# Patient Record
Sex: Female | Born: 1990 | Race: Black or African American | Hispanic: No | Marital: Single | Smoking: Current some day smoker
Health system: Southern US, Community
[De-identification: ages and names within clinical notes are randomized; demographics above are authoritative.]

## PROBLEM LIST (undated history)

## (undated) ENCOUNTER — Inpatient Hospital Stay (HOSPITAL_COMMUNITY): Payer: Self-pay

## (undated) DIAGNOSIS — F99 Mental disorder, not otherwise specified: Secondary | ICD-10-CM

## (undated) DIAGNOSIS — F32A Depression, unspecified: Secondary | ICD-10-CM

## (undated) DIAGNOSIS — K649 Unspecified hemorrhoids: Secondary | ICD-10-CM

## (undated) DIAGNOSIS — N760 Acute vaginitis: Principal | ICD-10-CM

## (undated) DIAGNOSIS — F329 Major depressive disorder, single episode, unspecified: Secondary | ICD-10-CM

## (undated) DIAGNOSIS — B9689 Other specified bacterial agents as the cause of diseases classified elsewhere: Secondary | ICD-10-CM

## (undated) DIAGNOSIS — F419 Anxiety disorder, unspecified: Secondary | ICD-10-CM

## (undated) DIAGNOSIS — N926 Irregular menstruation, unspecified: Secondary | ICD-10-CM

## (undated) HISTORY — DX: Other specified bacterial agents as the cause of diseases classified elsewhere: B96.89

## (undated) HISTORY — DX: Mental disorder, not otherwise specified: F99

## (undated) HISTORY — DX: Acute vaginitis: N76.0

## (undated) HISTORY — PX: NO PAST SURGERIES: SHX2092

## (undated) HISTORY — DX: Irregular menstruation, unspecified: N92.6

---
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
## 2011-01-31 ENCOUNTER — Inpatient Hospital Stay (HOSPITAL_COMMUNITY)
Admission: AD | Admit: 2011-01-31 | Discharge: 2011-01-31 | Disposition: A | Discharge status: Home / Self Care | Payer: Self-pay | Source: Ambulatory Visit | Attending: Obstetrics & Gynecology | Admitting: Obstetrics & Gynecology

## 2011-01-31 ENCOUNTER — Encounter (HOSPITAL_COMMUNITY): Payer: Self-pay

## 2011-01-31 DIAGNOSIS — A499 Bacterial infection, unspecified: Secondary | ICD-10-CM | POA: Insufficient documentation

## 2011-01-31 DIAGNOSIS — R109 Unspecified abdominal pain: Secondary | ICD-10-CM | POA: Insufficient documentation

## 2011-01-31 DIAGNOSIS — Z3202 Encounter for pregnancy test, result negative: Secondary | ICD-10-CM

## 2011-01-31 DIAGNOSIS — N760 Acute vaginitis: Secondary | ICD-10-CM | POA: Insufficient documentation

## 2011-01-31 DIAGNOSIS — B9689 Other specified bacterial agents as the cause of diseases classified elsewhere: Secondary | ICD-10-CM | POA: Insufficient documentation

## 2011-01-31 HISTORY — DX: Major depressive disorder, single episode, unspecified: F32.9

## 2011-01-31 HISTORY — DX: Anxiety disorder, unspecified: F41.9

## 2011-01-31 HISTORY — DX: Depression, unspecified: F32.A

## 2011-01-31 LAB — URINALYSIS, ROUTINE W REFLEX MICROSCOPIC
Nitrite: NEGATIVE
Protein, ur: NEGATIVE mg/dL
Specific Gravity, Urine: 1.025 (ref 1.005–1.030)
Urobilinogen, UA: 0.2 mg/dL (ref 0.0–1.0)

## 2011-01-31 LAB — URINE MICROSCOPIC-ADD ON

## 2011-01-31 LAB — WET PREP, GENITAL: Trich, Wet Prep: NONE SEEN

## 2011-01-31 LAB — POCT PREGNANCY, URINE: Preg Test, Ur: NEGATIVE

## 2011-01-31 MED ORDER — METRONIDAZOLE 500 MG PO TABS: 500.0000 mg | ORAL_TABLET | Freq: Two times a day (BID) | ORAL | Status: AC

## 2011-01-31 NOTE — ED Provider Notes (Signed)
Attestation of Attending Supervision of Advanced Practitioner: Evaluation and management procedures were performed by the PA/NP/CNM/OB Fellow under my supervision/collaboration. Chart reviewed, and agree with management and plan.  Kerri Asche, M.D. 01/31/2011 9:58 AM   

## 2011-01-31 NOTE — ED Provider Notes (Signed)
History   Pt presents today c/o lower abd cramping, weight gain, and nausea. She states she has been trying to get pregnant. She denies vag irritation, fever, or any other sx at this time. She reports her last episode of intercourse was yesterday.  Chief Complaint  Patient presents with  . Abdominal Pain   HPI  OB History    Grav Para Term Preterm Abortions TAB SAB Ect Mult Living   1    1  1          Past Medical History  Diagnosis Date  . Anxiety   . Depression     Past Surgical History  Procedure Date  . No past surgeries     History reviewed. No pertinent family history.  History  Substance Use Topics  . Smoking status: Never Smoker   . Smokeless tobacco: Never Used  . Alcohol Use: No    Allergies: No Known Allergies  Prescriptions prior to admission  Medication Sig Dispense Refill  . benzocaine (ORAJEL MAXIMUM STRENGTH) 20 % GEL Use as directed 1 application in the mouth or throat 2 (two) times daily as needed. For toothache.       . ibuprofen (ADVIL,MOTRIN) 200 MG tablet Take 200 mg by mouth every 6 (six) hours as needed. For stomach pains.         Review of Systems  Constitutional: Negative for fever.  Cardiovascular: Negative for chest pain.  Gastrointestinal: Positive for nausea and abdominal pain. Negative for vomiting, diarrhea and constipation.  Genitourinary: Negative for dysuria, urgency, frequency and hematuria.  Neurological: Negative for dizziness and headaches.  Psychiatric/Behavioral: Negative for depression and suicidal ideas.   Physical Exam   Blood pressure 115/76, pulse 80, temperature 98.6 F (37 C), temperature source Oral, resp. rate 16, height 5\' 2"  (1.575 m), weight 170 lb (77.111 kg), last menstrual period 01/04/2011.  Physical Exam  Nursing note and vitals reviewed. Constitutional: She is oriented to person, place, and time. She appears well-developed and well-nourished. No distress.  HENT:  Head: Normocephalic and atraumatic.    Eyes: EOM are normal. Pupils are equal, round, and reactive to light.  GI: Soft. She exhibits no distension. There is no tenderness. There is no rebound and no guarding.  Genitourinary: No bleeding around the vagina. Vaginal discharge found.       Pt tender on exam which made bimanual difficult. However, no obvious masses noted.  Neurological: She is alert and oriented to person, place, and time.  Skin: Skin is warm and dry. She is not diaphoretic.  Psychiatric: She has a normal mood and affect. Her behavior is normal. Judgment and thought content normal.    MAU Course  Procedures  Wet prep and GC/Chlamydia cultures done.  Results for orders placed during the hospital encounter of 01/31/11 (from the past 24 hour(s))  URINALYSIS, ROUTINE W REFLEX MICROSCOPIC     Status: Abnormal   Collection Time   01/31/11  8:14 AM      Component Value Range   Color, Urine YELLOW  YELLOW    APPearance CLEAR  CLEAR    Specific Gravity, Urine 1.025  1.005 - 1.030    pH 5.5  5.0 - 8.0    Glucose, UA NEGATIVE  NEGATIVE (mg/dL)   Hgb urine dipstick SMALL (*) NEGATIVE    Bilirubin Urine NEGATIVE  NEGATIVE    Ketones, ur NEGATIVE  NEGATIVE (mg/dL)   Protein, ur NEGATIVE  NEGATIVE (mg/dL)   Urobilinogen, UA 0.2  0.0 - 1.0 (mg/dL)  Nitrite NEGATIVE  NEGATIVE    Leukocytes, UA NEGATIVE  NEGATIVE   URINE MICROSCOPIC-ADD ON     Status: Normal   Collection Time   01/31/11  8:14 AM      Component Value Range   Squamous Epithelial / LPF RARE  RARE    WBC, UA 0-2  <3 (WBC/hpf)   RBC / HPF 0-2  <3 (RBC/hpf)  POCT PREGNANCY, URINE     Status: Normal   Collection Time   01/31/11  8:17 AM      Component Value Range   Preg Test, Ur NEGATIVE    WET PREP, GENITAL     Status: Abnormal   Collection Time   01/31/11  8:49 AM      Component Value Range   Yeast, Wet Prep NONE SEEN  NONE SEEN    Trich, Wet Prep NONE SEEN  NONE SEEN    Clue Cells, Wet Prep FEW (*) NONE SEEN    WBC, Wet Prep HPF POC FEW (*) NONE  SEEN      Assessment and Plan  BV: discussed with pt at length. Will tx with Flagyl. Warned of antabuse reaction  Neg preg test: discussed with pt at length. Advised pt to begin prenatal vit ASAP. She understands that it is best to be on prenatal vit at least 3 months prior to becoming pregnant. Also advised pt to establish care with PCP or OB/GYN prior to becoming preg.  Clinton Gallant. Rice III, DrHSc, MPAS, PA-C  01/31/2011, 8:54 AM   Henrietta Hoover, PA 01/31/11 [phone]

## 2011-01-31 NOTE — Progress Notes (Signed)
Pt states LMP-01/04/2011, had one positive/one negative upt. Has had pressure with voiding. Denies urgency/burning. Also denies bleeding or vag d/c changes. LLQ pain, dull and constant, rates 5/10.

## 2011-02-02 LAB — GC/CHLAMYDIA PROBE AMP, GENITAL
Chlamydia, DNA Probe: POSITIVE — AB
GC Probe Amp, Genital: NEGATIVE

## 2011-05-16 ENCOUNTER — Emergency Department (HOSPITAL_COMMUNITY)
Admission: EM | Admit: 2011-05-16 | Discharge: 2011-05-16 | Disposition: A | Discharge status: Home / Self Care | Payer: Self-pay | Attending: Emergency Medicine | Admitting: Emergency Medicine

## 2011-05-16 ENCOUNTER — Encounter (HOSPITAL_COMMUNITY): Payer: Self-pay | Admitting: Emergency Medicine

## 2011-05-16 DIAGNOSIS — F329 Major depressive disorder, single episode, unspecified: Secondary | ICD-10-CM | POA: Insufficient documentation

## 2011-05-16 DIAGNOSIS — F3289 Other specified depressive episodes: Secondary | ICD-10-CM | POA: Insufficient documentation

## 2011-05-16 DIAGNOSIS — F411 Generalized anxiety disorder: Secondary | ICD-10-CM | POA: Insufficient documentation

## 2011-05-16 DIAGNOSIS — Z3202 Encounter for pregnancy test, result negative: Secondary | ICD-10-CM | POA: Insufficient documentation

## 2011-05-16 NOTE — Discharge Instructions (Signed)
Your pregnancy test was negative. Please follow up with your primary care doctor as needed. Over the counter pregnancy tests are equally as accurate as the pregnancy tests we use here. Please consider using over the counter tests in the future.  RESOURCE GUIDE  Dental Problems  Patients with Medicaid: [facility] [phone] [address]                                           [phone] Phone:  [phone]                                                  Phone:  [phone]  If unable to pay or uninsured, contact:  [facility] or [facility] to become qualified for the adult dental clinic.  Chronic Pain Problems Contact [facility]  [phone] Patients need to be referred by their primary care doctor.  Insufficient Money for Medicine Contact [employer]:  call "211" or [facility] [phone].  No Primary Care Doctor Call [facility]  [phone] Other agencies that provide inexpensive medical care    [facility]  [phone]    [facility]  [phone]    [facility]  [phone]    [facility]  [phone]    [facility]  [phone]    [facility]  [phone]  Psychological Services [facility]  [phone] [facility]  [phone] [facility]   [phone] (emergency services [phone])  Substance Abuse Resources [facility]  [phone] [facility] [phone] [facility] [phone] Floydene Flock [phone] Residential & Outpatient Substance Abuse Program  [phone]  Abuse/Neglect [facility] Child Abuse Hotline [phone] [facility] Child Abuse Hotline [phone] (After Hours)  Emergency Shelter [facility] [phone]  Maternity Homes [facility] [place] [facility] [phone] [facility] [phone]  MRSA Hotline #:   705-024-2385    [facility] Resources  [facility]     [employer]                          [facility] Dept. [address] [place]                       [address]      [address]  [place]                                                Cristobal Goldmann Phone:  [phone]  Phone:  [phone]                 Phone:  [phone]  [facility] Phone:  [phone]  [facility] Child Abuse Hotline [phone] [phone] (After Hours)  Pregnancy Tests HOW DO PREGNANCY TESTS WORK? All pregnancy tests look for a special hormone in the urine or blood that is only present in pregnant women. This hormone, human chorionic gonadotropin (hCG), is also called the pregnancy hormone.  WHAT IS THE DIFFERENCE BETWEEN A URINE AND A BLOOD PREGNANCY TEST? IS ONE BETTER THAN THE OTHER? There are two types of pregnancy tests.  Blood tests.   Urine tests.  Both tests look for the presence of hCG, the pregnancy hormone. Many women use a urine test or home pregnancy test (HPT) to find out if they are pregnant. HPTs are cheap, easy to use, can be done at home, and are private. When a woman has a positive result on an HPT, she needs to see her caregiver right away. The caregiver can confirm a positive HPT result with another urine test, a blood test, ultrasound, and a pelvic exam.  There are two types of blood tests you can get from a caregiver.   A quantitative blood test (or the beta hCG test). This test measures the exact amount of hCG in the blood. This means it can pick up very small amounts of hCG, making it a very accurate test.   A qualitative hCG blood test. This test gives a simple yes or no answer to whether you are pregnant. This test is more like a urine test in terms of its accuracy.  Blood tests can pick up hCG earlier in a  pregnancy than urine tests can. Blood tests can tell if you are pregnant about 6 to 8 days after you release an egg from an ovary (ovulate). Urine tests can determine pregnancy about 2 weeks after ovulation. Some more sensitive urine tests can tell if you are pregnant as early as 6 days or even 1 day after you miss a menstrual period.  HOW IS A HOME PREGNANCY TEST DONE?  There are many types of home pregnancy tests or HPTs that can be bought over-the-counter at drug or discount stores.   Some involve collecting your urine in a cup and dipping a stick into the urine or putting some of the urine into a special container with an eyedropper.   Others are done by placing a stick into your urine stream.   Tests vary in how long you need to wait for the stick or container to turn a certain color or have a symbol on it (like a plus or a minus).   All tests come with written instructions. Most tests also have toll-free phone numbers to call if you have any questions about how to do the test or read the results.  HOW ACCURATE ARE HOME PREGNANCY TESTS?  HPTs are very accurate. Most brands of HPTs say they are 97% to 99% accurate when taken 1 week after missing your menstrual period, but this can vary with actual use. Each brand varies in how sensitive it is in picking up the pregnancy hormone hCG. If a test is not done correctly, it will be less accurate. Always check the package to make sure it is not past its expiration date. If it is, it will not be accurate. Most brands of HPTs tell users to do the test again in a few days, no matter  what the results.  If you use an HPT too early in your pregnancy, you may not have enough of the pregnancy hormone hCG in your urine to have a positive test result. Most HPTs will be accurate if you test yourself around the time your period is due (about 2 weeks after you ovulate). You can get a negative test result if you are not pregnant or if you ovulated later than you thought  you did. You may also have problems with the pregnancy, which affects the amount of hCG you have in your urine. If your HPT is negative, test yourself again within a few days to 1 week. If you keep getting a negative result and think you are pregnant, talk with your caregiver right away about getting a blood pregnancy test.  FALSE POSITIVE PREGNANCY TEST A false positive HPT can happen if there is blood or protein present in your urine. A false positive can also happen if you were recently pregnant or if you take a pregnancy test too soon after taking fertility drug that contains hCG. Also, some prescription medicines such as water pills (diuretics), tranquilizers, seizure medicines, psychiatric medicines, and allergy and nausea medicines (promethazine) give false positive readings. FALSE NEGATIVE PREGNANCY TEST  A false negative HPT can happen if you do the test too early. Try to wait until you are at least 1 day late for your menstrual period.   It may happen if you wait too long to test the urine (longer than 15 minutes).   It may also happen if the urine is too diluted because you drank a lot of fluids before getting the urine sample. It is best to test the first morning urine after you get out of bed.  If your menstrual period did not start after a week of a negative HPT, repeat the pregnancy test. CAN ANYTHING INTERFERE WITH HOME PREGNANCY TEST RESULTS?  Most medicines, both over-the-counter and prescription drugs, including birth control pills and antibiotics, should not affect the results of a HPT. Only those drugs that have the pregnancy hormone hCG in them can give a false positive test result. Drugs that have hCG in them may be used for treating infertility (not being able to get pregnant). Alcohol and illegal drugs do not affect HPT results, but you should not be using these substances if you are trying to get pregnant. If you have a positive pregnancy test, call your caregiver to make an  appointment to begin prenatal care. Document Released: 02/02/2003 Document Revised: 01/19/2011 Document Reviewed: 04/15/2010 [facility] Patient Information 2012 [place], [place].

## 2011-05-16 NOTE — ED Notes (Signed)
Pt presents for pregnancy test.  Pt reports last period was 2/23; reports nausea, vaginal discharge that pt describes as "white sticky rice" and foul odor.  Pt requesting discharge papers, reports that she needs to leave.

## 2011-05-16 NOTE — ED Provider Notes (Signed)
Medical screening examination/treatment/procedure(s) were conducted as a shared visit with non-physician practitioner(s) and myself.  I personally evaluated the patient during the encounter  Toy Baker, MD 05/16/11 1549

## 2011-05-16 NOTE — ED Provider Notes (Signed)
History     CSN: [id number]  Arrival date & time 05/16/11  0534   First MD Initiated Contact with Patient 05/16/11 0710      Chief Complaint  Patient presents with  . Possible Pregnancy    (Consider location/radiation/quality/duration/timing/severity/associated sxs/prior treatment) HPI Hx from patient. 21 year old female who presents requesting a pregnancy test. Last menstrual period was February 23. She is sexually active and denies contraceptive use. Denies abdominal pain, nausea, vomiting. No other complaints. No pregnancy tests taken at home.  Past Medical History  Diagnosis Date  . Anxiety   . Depression     Past Surgical History  Procedure Date  . No past surgeries     No family history on file.  History  Substance Use Topics  . Smoking status: Never Smoker   . Smokeless tobacco: Never Used  . Alcohol Use: No    OB History    Grav Para Term Preterm Abortions TAB SAB Ect Mult Living   2    1  1          Review of Systems review of systems as indicated in the history of present illness.  Allergies  Review of patient's allergies indicates no known allergies.  Home Medications   Current Outpatient Rx  Name Route Sig Dispense Refill  . PHILLIPS STOOL SOFTENER PO Oral Take 1 capsule by mouth daily.      BP 129/68  Pulse 70  Temp(Src) 98.6 F (37 C) (Oral)  Resp 20  SpO2 98%  LMP 01/04/2011  Physical Exam  Nursing note and vitals reviewed. Constitutional: She appears well-developed and well-nourished. No distress.  HENT:  Head: Normocephalic and atraumatic.  Cardiovascular: Normal rate.   Pulmonary/Chest: Effort normal.  Skin: Skin is warm and dry. She is not diaphoretic.  Psychiatric: Her affect is angry.       States "I already know my test is negative. I just want my discharge paperwork."    ED Course  Procedures (including critical care time)   Labs Reviewed  POCT PREGNANCY, URINE   No results found.   1. Pregnancy test negative        MDM  Pregnancy test negative. Return precautions discussed.       Grant Fontana, [place] 05/16/11 [phone]

## 2011-05-16 NOTE — ED Notes (Signed)
PT. REQUESTING PREGNANCY TEST , LMP LAST FEB. 2013 .

## 2011-06-12 LAB — OB RESULTS CONSOLE RUBELLA ANTIBODY, IGM: Rubella: IMMUNE

## 2011-06-12 LAB — OB RESULTS CONSOLE ABO/RH: RH Type: POSITIVE

## 2011-06-12 LAB — OB RESULTS CONSOLE HIV ANTIBODY (ROUTINE TESTING): HIV: NONREACTIVE

## 2011-06-12 LAB — OB RESULTS CONSOLE RPR: RPR: NONREACTIVE

## 2011-10-10 ENCOUNTER — Encounter (HOSPITAL_COMMUNITY): Payer: Self-pay | Admitting: *Deleted

## 2011-10-10 ENCOUNTER — Inpatient Hospital Stay (HOSPITAL_COMMUNITY)
Admission: AD | Admit: 2011-10-10 | Discharge: 2011-10-10 | Disposition: A | Discharge status: Home / Self Care | Payer: Medicaid - Out of State | Source: Ambulatory Visit | Attending: Obstetrics & Gynecology | Admitting: Obstetrics & Gynecology

## 2011-10-10 DIAGNOSIS — Z331 Pregnant state, incidental: Secondary | ICD-10-CM

## 2011-10-10 DIAGNOSIS — O99891 Other specified diseases and conditions complicating pregnancy: Secondary | ICD-10-CM | POA: Insufficient documentation

## 2011-10-10 DIAGNOSIS — O0930 Supervision of pregnancy with insufficient antenatal care, unspecified trimester: Secondary | ICD-10-CM

## 2011-10-10 HISTORY — DX: Unspecified hemorrhoids: K64.9

## 2011-10-10 NOTE — MAU Note (Signed)
Moved here form [place]; c/o some spotting; desires to find a OB in [place];

## 2011-10-10 NOTE — MAU Provider Note (Signed)
Chief Complaint:  Vaginal Bleeding   First Provider Initiated Contact with Patient 10/12/11 0507    HPI: Tiffany [name] is a 21 y.o. G2P0010 at 6w1dwho presents to maternity admissions requesting list of providers. Moved to [place] from [place]. No prenatal in 6 weeks and knows she needs to be seen. Scant spotting ~4 days ago. Wants to know sex of baby.  Denies contractions, leakage of fluid or vaginal bleeding. Good fetal movement. Had an episode 3 days ago of pink tinge on toilet paper but no frank blood.   Past Medical History: Past Medical History  Diagnosis Date  . Anxiety   . Depression   . Hemorrhoids     Past obstetric history: OB History    Grav Para Term Preterm Abortions TAB SAB Ect Mult Living   2    1  1         # Outc Date GA Lbr Len/2nd Wgt Sex Del Anes PTL Lv   1 SAB            2 CUR               Past Surgical History: Past Surgical History  Procedure Date  . No past surgeries     Family History: Family History  Problem Relation Age of Onset  . Other Neg Hx     Social History: History  Substance Use Topics  . Smoking status: Never Smoker   . Smokeless tobacco: Never Used  . Alcohol Use: No    Allergies:  Allergies  Allergen Reactions  . Mushroom Extract Complex Shortness Of Breath and Swelling    Throat swelling    Meds:  Prescriptions prior to admission  Medication Sig Dispense Refill  . Prenatal Vit-Fe Fumarate-FA (PRENATAL MULTIVITAMIN) TABS Take 1 tablet by mouth every morning.        ROS: Pertinent findings in history of present illness.  Physical Exam  Blood pressure 104/57, pulse 64, temperature 98 F (36.7 C), temperature source Oral, resp. rate 16, last menstrual period 01/04/2011. GENERAL: Well-developed, well-nourished female in no acute distress.  HEENT: normocephalic HEART: normal rate RESP: normal effort ABDOMEN: Soft, non-tender, gravid appropriate for gestational age EXTREMITIES: Nontender, no edema NEURO:  alert and oriented  Pelvic: deferred. FHT:  Baseline 140s- just dopplers. No strip Contractions: none   Labs: No results found for this or any previous visit (from the past 24 hour(s)).  Imaging:  No results found. ED Course See plan.   Assessment: 1. IUP (intrauterine pregnancy), incidental     Plan: Discharge home. No further workup here tonight in an asymptomatic pt.  List of providers in the area given to patient with instructions to call for appt - discussed that [place] is not done in the MAU for gender and this can be discussed with her new doctor team here - will send record release to [place] for previous records.  Labor precautions and fetal kick counts  Medication List  As of 10/10/2011  8:50 PM   ASK your doctor about these medications         prenatal multivitamin Tabs   Take 1 tablet by mouth every morning.           Dr. Erin Fulling and I were consulted RE: POC and agree w/ the above.  [place], [place] 10/11/2011 8:28 PM

## 2011-10-12 NOTE — MAU Provider Note (Signed)
Attestation of Attending Supervision of Advanced Practitioner (CNM/NP): Evaluation and management procedures were performed by the Advanced Practitioner under my supervision and collaboration.  I have reviewed the Advanced Practitioner's note and chart, and I agree with the management and plan.  HARRAWAY-SMITH, Loralie Malta 3:04 PM     

## 2011-12-13 ENCOUNTER — Encounter (HOSPITAL_COMMUNITY): Payer: Self-pay | Admitting: *Deleted

## 2011-12-13 ENCOUNTER — Inpatient Hospital Stay (HOSPITAL_COMMUNITY)
Admission: EM | Admit: 2011-12-13 | Discharge: 2011-12-14 | Disposition: A | Discharge status: Other Acute Inpatient Hospital | Payer: Self-pay | Attending: Emergency Medicine | Admitting: Emergency Medicine

## 2011-12-13 DIAGNOSIS — F329 Major depressive disorder, single episode, unspecified: Secondary | ICD-10-CM | POA: Insufficient documentation

## 2011-12-13 DIAGNOSIS — F3289 Other specified depressive episodes: Secondary | ICD-10-CM | POA: Insufficient documentation

## 2011-12-13 DIAGNOSIS — Z8679 Personal history of other diseases of the circulatory system: Secondary | ICD-10-CM | POA: Insufficient documentation

## 2011-12-13 DIAGNOSIS — O9989 Other specified diseases and conditions complicating pregnancy, childbirth and the puerperium: Secondary | ICD-10-CM | POA: Insufficient documentation

## 2011-12-13 DIAGNOSIS — F411 Generalized anxiety disorder: Secondary | ICD-10-CM | POA: Insufficient documentation

## 2011-12-13 DIAGNOSIS — R109 Unspecified abdominal pain: Secondary | ICD-10-CM | POA: Insufficient documentation

## 2011-12-13 DIAGNOSIS — O479 False labor, unspecified: Secondary | ICD-10-CM

## 2011-12-13 LAB — URINALYSIS, ROUTINE W REFLEX MICROSCOPIC
Hgb urine dipstick: NEGATIVE
Ketones, ur: NEGATIVE mg/dL
Protein, ur: NEGATIVE mg/dL
Urobilinogen, UA: 0.2 mg/dL (ref 0.0–1.0)

## 2011-12-13 NOTE — Progress Notes (Addendum)
Pt is 33wk2day (EDC 01/29/12), G2P0. C/O sharp abdominal pain x2hrs.  External monitoring started @2219 , fht 150bpm, assessing for contractions.  No c/o leaking fluid or vag bleeding.

## 2011-12-13 NOTE — ED Notes (Signed)
Patient placed on fetal monitor.  Spoke with Olegario Messier at [facility] [employer].  Fetal heart rate 149-153.  Patient c/o sharp abdominal pain that started around 2 hours ago.  Patient denies any leakage of fluid, N/V or bleeding.

## 2011-12-13 NOTE — ED Notes (Signed)
Patient placed on bedside commode.

## 2011-12-13 NOTE — ED Notes (Signed)
Report called to Mariane Duval, at [facility].

## 2011-12-13 NOTE — ED Notes (Signed)
Pregnant, sharp abdominal pain for the past 2 hours, denies nausea or vomiting

## 2011-12-13 NOTE — ED Provider Notes (Signed)
History    This chart was scribed for Tiffany Lennert, MD, MD by Smitty Pluck. The patient was seen in room APA01 and the patient's care was started at 10:17PM.   CSN: [id number]  Arrival date & time 12/13/11  2156      Chief Complaint  Patient presents with  . Abdominal Pain    (Consider location/radiation/quality/duration/timing/severity/associated sxs/prior treatment) Patient is a 21 y.o. female presenting with abdominal pain. The history is provided by the patient. No language interpreter was used.  Abdominal Pain The primary symptoms of the illness include abdominal pain. The primary symptoms of the illness do not include fatigue or diarrhea. The current episode started 1 to 2 hours ago. The onset of the illness was sudden. The problem has not changed since onset. The abdominal pain radiates to the back.  The patient states that she believes she is currently pregnant. Symptoms associated with the illness do not include hematuria, frequency or back pain.   Tiffany [name] is a 21 y.o. female who is [redacted] weeks pregnant presents to the Emergency Department complaining of sharp abdominal pain radiating to her back onset today 2 hours ago. Pt reports that the contractions are 5-6 minutes apart. Pt denies nausea, vomiting, diarrhea and any other pain.    Pt goes to [facility] for medical treatment   Past Medical History  Diagnosis Date  . Anxiety   . Depression   . Hemorrhoids     Past Surgical History  Procedure Date  . No past surgeries     Family History  Problem Relation Age of Onset  . Other Neg Hx     History  Substance Use Topics  . Smoking status: Never Smoker   . Smokeless tobacco: Never Used  . Alcohol Use: No    OB History    Grav Para Term Preterm Abortions TAB SAB Ect Mult Living   2    1  1          Review of Systems  Constitutional: Negative for fatigue.  HENT: Negative for congestion, sinus pressure and ear discharge.   Eyes:  Negative for discharge.  Respiratory: Negative for cough.   Cardiovascular: Negative for chest pain.  Gastrointestinal: Positive for abdominal pain. Negative for diarrhea.  Genitourinary: Negative for frequency and hematuria.  Musculoskeletal: Negative for back pain.  Skin: Negative for rash.  Neurological: Negative for seizures and headaches.  Hematological: Negative.   Psychiatric/Behavioral: Negative for hallucinations.    Allergies  Mushroom extract complex  Home Medications   Current Outpatient Rx  Name Route Sig Dispense Refill  . PRENATAL MULTIVITAMIN CH Oral Take 1 tablet by mouth every morning.      BP 111/69  Pulse 71  Temp 98.1 F (36.7 C) (Oral)  Resp 20  Ht 5\' 2"  (1.575 m)  Wt 192 lb (87.091 kg)  BMI 35.12 kg/m2  SpO2 100%  LMP 01/04/2011  Physical Exam  Nursing note and vitals reviewed. Constitutional: She is oriented to person, place, and time. She appears well-developed.  HENT:  Head: Normocephalic and atraumatic.  Eyes: Conjunctivae normal and EOM are normal. No scleral icterus.  Neck: Neck supple. No thyromegaly present.  Cardiovascular: Normal rate and regular rhythm.  Exam reveals no gallop and no friction rub.   No murmur heard. Pulmonary/Chest: No stridor. She has no wheezes. She has no rales. She exhibits no tenderness.  Abdominal: She exhibits no distension. There is no tenderness. There is no rebound.  Genitourinary: Vagina normal.  0% effaced  cervix closed   Musculoskeletal: Normal range of motion. She exhibits no edema.  Lymphadenopathy:    She has no cervical adenopathy.  Neurological: She is oriented to person, place, and time. Coordination normal.  Skin: No rash noted. No erythema.  Psychiatric: She has a normal mood and affect. Her behavior is normal.    ED Course  Procedures (including critical care time) DIAGNOSTIC STUDIES: Oxygen Saturation is 100% on room air, normal by my interpretation.    COORDINATION OF  CARE: 10:24 PM Discussed ED treatment with pt     Labs Reviewed - No data to display No results found.   No diagnosis found.  Dr[facility] Despina Hidden [facility]  MDM        The chart was scribed for me under my direct supervision.  I personally performed the history, physical, and medical decision making and all procedures in the evaluation of this patient.Tiffany Lennert, MD 12/13/11 [phone]

## 2011-12-14 ENCOUNTER — Encounter (HOSPITAL_COMMUNITY): Payer: Self-pay | Admitting: *Deleted

## 2011-12-14 NOTE — MAU Note (Signed)
Pt states she had sex around 2200 tonight-after that she started having a lot of cramping and went to [facility] to be evaluated-states the craming is getting less now

## 2011-12-14 NOTE — Progress Notes (Signed)
None     Chief Complaint:  Abdominal Pain pt was evaluated at [facility] and sent here for further observation.  She came in with c/o cramps, which have lessened now.  Only treatment has been IVF  Tiffany [name] is  21 y.o. G2P0010 at [redacted]w[redacted]d presents complaining of Abdominal Pain .  She states none contractions are associated with none vaginal bleeding, intact membranes, along with active fetal movement.   Obstetrical/Gynecological History: Menstrual History: OB History    Grav Para Term Preterm Abortions TAB SAB Ect Mult Living   2    1  1           Patient's last menstrual period was 01/04/2011.     Past Medical History: Past Medical History  Diagnosis Date  . Anxiety   . Depression   . Hemorrhoids     Past Surgical History: Past Surgical History  Procedure Date  . No past surgeries     Family History: Family History  Problem Relation Age of Onset  . Other Neg Hx   . Diabetes Mother     Social History: History  Substance Use Topics  . Smoking status: Never Smoker   . Smokeless tobacco: Never Used  . Alcohol Use: No    Allergies:  Allergies  Allergen Reactions  . Mushroom Extract Complex Shortness Of Breath and Swelling    Throat swelling    Meds:  Prescriptions prior to admission  Medication Sig Dispense Refill  . Prenatal Vit-Fe Fumarate-FA (PRENATAL MULTIVITAMIN) TABS Take 1 tablet by mouth every morning.        Review of Systems - Please refer to the aforementioned patients' reports.     Physical Exam  Blood pressure 101/65, pulse 73, temperature 98 F (36.7 C), temperature source Oral, resp. rate 20, height 5\' 2"  (1.575 m), weight 87.091 kg (192 lb), last menstrual period 01/04/2011, SpO2 98.00%. GENERAL: Well-developed, well-nourished female in no acute distress.  LUNGS: Clear to auscultation bilaterally.  HEART: Regular rate and rhythm. ABDOMEN: Soft, nontender, nondistended, gravid.  EXTREMITIES: Nontender, no edema, 2+  distal pulses. CERVICAL EXAM: Dilatation 0cm   Effacement 0%   Station -3   Presentation: cephalic FHT:  Baseline rate 140 bpm   Variability moderate  Accelerations present   Decelerations none Contractions: some irritability  Labs: Recent Results (from the past 24 hour(s))  URINALYSIS, ROUTINE W REFLEX MICROSCOPIC   Collection Time   12/13/11 11:00 PM      Component Value Range   Color, Urine YELLOW  YELLOW   APPearance CLEAR  CLEAR   Specific Gravity, Urine 1.020  1.005 - 1.030   pH 6.5  5.0 - 8.0   Glucose, UA NEGATIVE  NEGATIVE mg/dL   Hgb urine dipstick NEGATIVE  NEGATIVE   Bilirubin Urine NEGATIVE  NEGATIVE   Ketones, ur NEGATIVE  NEGATIVE mg/dL   Protein, ur NEGATIVE  NEGATIVE mg/dL   Urobilinogen, UA 0.2  0.0 - 1.0 mg/dL   Nitrite NEGATIVE  NEGATIVE   Leukocytes, UA NEGATIVE  NEGATIVE   Imaging Studies:  No results found.  Assessment: Tiffany [name] is  21 y.o. G2P0010 at [redacted]w[redacted]d presents with uterine irritability, resolved after IVF.  Plan: D/C home  CRESENZO-DISHMAN,Liborio Saccente 10/31/20131:13 AM

## 2012-01-10 LAB — OB RESULTS CONSOLE GBS: GBS: POSITIVE

## 2012-01-24 ENCOUNTER — Telehealth (HOSPITAL_COMMUNITY): Payer: Self-pay | Admitting: *Deleted

## 2012-01-24 ENCOUNTER — Encounter (HOSPITAL_COMMUNITY): Payer: Self-pay | Admitting: *Deleted

## 2012-01-24 NOTE — Telephone Encounter (Signed)
Preadmission screen  

## 2012-01-28 ENCOUNTER — Inpatient Hospital Stay (HOSPITAL_COMMUNITY)
Admission: RE | Admit: 2012-01-28 | Discharge: 2012-01-31 | DRG: 766 | Disposition: A | Discharge status: Home / Self Care | Payer: Medicaid Other | Source: Ambulatory Visit | Attending: Obstetrics & Gynecology | Admitting: Obstetrics & Gynecology

## 2012-01-28 ENCOUNTER — Encounter (HOSPITAL_COMMUNITY): Payer: Self-pay

## 2012-01-28 DIAGNOSIS — O365990 Maternal care for other known or suspected poor fetal growth, unspecified trimester, not applicable or unspecified: Principal | ICD-10-CM | POA: Diagnosis present

## 2012-01-28 DIAGNOSIS — D649 Anemia, unspecified: Secondary | ICD-10-CM | POA: Diagnosis not present

## 2012-01-28 DIAGNOSIS — Z22330 Carrier of Group B streptococcus: Secondary | ICD-10-CM

## 2012-01-28 DIAGNOSIS — O0930 Supervision of pregnancy with insufficient antenatal care, unspecified trimester: Secondary | ICD-10-CM

## 2012-01-28 DIAGNOSIS — O99892 Other specified diseases and conditions complicating childbirth: Secondary | ICD-10-CM | POA: Diagnosis present

## 2012-01-28 DIAGNOSIS — O9903 Anemia complicating the puerperium: Secondary | ICD-10-CM | POA: Diagnosis not present

## 2012-01-28 LAB — CBC
HCT: 38.2 % (ref 36.0–46.0)
Hemoglobin: 12.5 g/dL (ref 12.0–15.0)
MCH: 27.8 pg (ref 26.0–34.0)
MCV: 84.2 fL (ref 78.0–100.0)
MCV: 84.5 fL (ref 78.0–100.0)
Platelets: 209 10*3/uL (ref 150–400)
Platelets: 197 10*3/uL (ref 150–400)
RBC: 4.5 MIL/uL (ref 3.87–5.11)
RBC: 4.52 MIL/uL (ref 3.87–5.11)
WBC: 5.9 10*3/uL (ref 4.0–10.5)
WBC: 6.8 10*3/uL (ref 4.0–10.5)

## 2012-01-28 LAB — COMPREHENSIVE METABOLIC PANEL
Albumin: 2.7 g/dL — ABNORMAL LOW (ref 3.5–5.2)
BUN: 8 mg/dL (ref 6–23)
Calcium: 9 mg/dL (ref 8.4–10.5)
Creatinine, Ser: 0.89 mg/dL (ref 0.50–1.10)
Total Protein: 6.1 g/dL (ref 6.0–8.3)

## 2012-01-28 LAB — TYPE AND SCREEN: Antibody Screen: NEGATIVE

## 2012-01-28 MED ORDER — LACTATED RINGERS IV SOLN
INTRAVENOUS | Status: DC
  Administered 2012-01-28: 22:00:00 via INTRAVENOUS

## 2012-01-28 MED ORDER — MISOPROSTOL 25 MCG QUARTER TABLET
25.0000 ug | ORAL_TABLET | ORAL | Status: DC | PRN
  Administered 2012-01-28: 25 ug via VAGINAL
  Filled 2012-01-28: qty 0.25

## 2012-01-28 MED ORDER — PENICILLIN G POTASSIUM 5000000 UNITS IJ SOLR
5.0000 10*6.[IU] | Freq: Once | INTRAVENOUS | Status: AC
  Administered 2012-01-28: 5 10*6.[IU] via INTRAVENOUS
  Filled 2012-01-28: qty 5

## 2012-01-28 MED ORDER — OXYCODONE-ACETAMINOPHEN 5-325 MG PO TABS: 1.0000 | ORAL_TABLET | ORAL | Status: DC | PRN

## 2012-01-28 MED ORDER — TERBUTALINE SULFATE 1 MG/ML IJ SOLN: 0.2500 mg | Freq: Once | INTRAMUSCULAR | Status: AC | PRN

## 2012-01-28 MED ORDER — OXYTOCIN 40 UNITS IN LACTATED RINGERS INFUSION - SIMPLE MED
1.0000 m[IU]/min | INTRAVENOUS | Status: DC
  Administered 2012-01-28: 1 m[IU]/min via INTRAVENOUS
  Filled 2012-01-28: qty 1000

## 2012-01-28 MED ORDER — FENTANYL CITRATE 0.05 MG/ML IJ SOLN
100.0000 ug | INTRAMUSCULAR | Status: DC | PRN
  Administered 2012-01-28 – 2012-01-29 (×4): 100 ug via INTRAVENOUS
  Filled 2012-01-28 (×4): qty 2

## 2012-01-28 MED ORDER — FLEET ENEMA 7-19 GM/118ML RE ENEM: 1.0000 | ENEMA | RECTAL | Status: DC | PRN

## 2012-01-28 MED ORDER — IBUPROFEN 600 MG PO TABS: 600.0000 mg | ORAL_TABLET | Freq: Four times a day (QID) | ORAL | Status: DC | PRN

## 2012-01-28 MED ORDER — LIDOCAINE HCL (PF) 1 % IJ SOLN
30.0000 mL | INTRAMUSCULAR | Status: DC | PRN
  Filled 2012-01-28: qty 30

## 2012-01-28 MED ORDER — ACETAMINOPHEN 325 MG PO TABS
650.0000 mg | ORAL_TABLET | ORAL | Status: DC | PRN
  Administered 2012-01-28: 650 mg via ORAL
  Filled 2012-01-28: qty 2

## 2012-01-28 MED ORDER — LACTATED RINGERS IV SOLN
500.0000 mL | INTRAVENOUS | Status: DC | PRN
  Administered 2012-01-28: 500 mL via INTRAVENOUS

## 2012-01-28 MED ORDER — PENICILLIN G POTASSIUM 5000000 UNITS IJ SOLR
2.5000 10*6.[IU] | INTRAVENOUS | Status: DC
  Administered 2012-01-28 – 2012-01-29 (×4): 2.5 10*6.[IU] via INTRAVENOUS
  Filled 2012-01-28 (×8): qty 2.5

## 2012-01-28 MED ORDER — OXYTOCIN BOLUS FROM INFUSION: 500.0000 mL | INTRAVENOUS | Status: DC

## 2012-01-28 MED ORDER — CITRIC ACID-SODIUM CITRATE 334-500 MG/5ML PO SOLN
30.0000 mL | ORAL | Status: DC | PRN
  Administered 2012-01-29: 30 mL via ORAL
  Filled 2012-01-28: qty 15

## 2012-01-28 MED ORDER — ONDANSETRON HCL 4 MG/2ML IJ SOLN: 4.0000 mg | Freq: Four times a day (QID) | INTRAMUSCULAR | Status: DC | PRN

## 2012-01-28 MED ORDER — OXYTOCIN 40 UNITS IN LACTATED RINGERS INFUSION - SIMPLE MED: 62.5000 mL/h | INTRAVENOUS | Status: DC

## 2012-01-28 NOTE — H&P (Signed)
Tiffany [name] is a 21 y.o. female G2P0010 at [redacted]w[redacted]d presenting for induction of labor for IUGR. [place] on 11/24 showed EFW in the 3%ile range, with remains on [place] from 12/10. Dopplers normal and BPP 8/8. Pt reports rare/intermittent contractions but states she didn't realize the "crampy, tightening" feelings she has been having that "feels like menstrual cramps" were contractions. No bleeding or LOF. Good fetal movements. Denies fever/chills, RUQ pain, headache/change in vision.  Prenatal care from 7w to 16w in [place], then transferred care to [facility] at 31w; no PNC between 16 and 31 weeks. Uncomplicated prenatal course other than IUGR, as above.  Maternal Medical History:  Reason for admission: IOL for IUGR  Contractions: Frequency: irregular.   Intermittent  Fetal activity: Perceived fetal activity is normal.   Last perceived fetal movement was within the past hour.    Prenatal complications: IUGR (11/24, EFW at 3%ile; 12/10, IUGR remains, normal Dopplers).   Prenatal Complications - Diabetes: none.    OB History    Grav Para Term Preterm Abortions TAB SAB Ect Mult Living   2    1  1         Past Medical History  Diagnosis Date  . Anxiety   . Hemorrhoids   . Mental disorder     bipolar  . Depression    Past Surgical History  Procedure Date  . No past surgeries    Family History: family history includes Diabetes in her mother.  There is no history of Other. Social History:  reports that she has never smoked. She has never used smokeless tobacco. She reports that she does not drink alcohol or use illicit drugs.  ROS: See HPI  Dilation: Fingertip Effacement (%): 40 Station: -3 Exam by:: [name], CNM Blood pressure 130/86, pulse 89, temperature 98.3 F (36.8 C), temperature source Oral, resp. rate 18, height 5\' 2"  (1.575 m), weight 95.255 kg (210 lb), last menstrual period 01/04/2011. Maternal Exam:  Uterine Assessment: Contraction strength is mild.   Contraction duration is 15 seconds. Contraction frequency is irregular.   Abdomen: Fetal presentation: vertex  Introitus: Normal vulva. Normal vagina.  Ferning test: not done.  Nitrazine test: not done. Amniotic fluid character: not assessed.  Pelvis: adequate for delivery.   Cervix: Cervix evaluated by digital exam.     Fetal Exam Fetal Monitor Review: Mode: ultrasound.   Baseline rate: 135.  Variability: moderate (6-25 bpm).   Pattern: accelerations present and no decelerations.    Fetal State Assessment: Category I - tracings are normal.     Physical Exam  Vitals reviewed. Constitutional: She is oriented to person, place, and time. She appears well-developed and well-nourished. No distress.  HENT:  Head: Normocephalic and atraumatic.  Eyes: Conjunctivae normal are normal. Pupils are equal, round, and reactive to light.  Neck: Normal range of motion. Neck supple.  Cardiovascular: Normal rate, regular rhythm and normal heart sounds.   No murmur heard. Respiratory: Effort normal and breath sounds normal. She has no wheezes.  GI: Soft. Bowel sounds are normal. She exhibits no distension. There is no tenderness.  Genitourinary: Vagina normal. Pelvic exam was performed with patient prone.       Dilation: Fingertip Effacement (%): 40 Cervical Position: Posterior Station: -3 Presentation: Vertex Exam by:: [name], CNM  Musculoskeletal: Normal range of motion. She exhibits edema (mild LE, bilateral, symmetric).  Neurological: She is alert and oriented to person, place, and time.  Skin: Skin is warm and dry.  Psychiatric: She has  a normal mood and affect. Her behavior is normal.    Prenatal labs: ABO, Rh: --/--/B POS (12/15 0815) Antibody: NEG (12/15 0815) Rubella: Immune (04/29 0000) RPR: Nonreactive (04/29 0000)  HBsAg: Negative (04/29 0000)  HIV: Non-reactive (04/29 0000)  GBS: Positive (11/27 0000)   Assessment/Plan: 21 y.o. G2P0010 at [redacted]w[redacted]d -SIUP for  induction of labor due to IUGR  -admit to birthing suites  -routine L&D orders plus Cytotec for cervical ripening, then Pitocin  -GBS POSITIVE, prophylaxis with PCN  Above discussed with [name], CNM  Street, Christopher 01/28/2012, 9:39 AM  Seen and agree with note.  Wynelle Bourgeois CNM

## 2012-01-28 NOTE — Progress Notes (Addendum)
Tiffany [name] is a 21 y.o. G2P0010 at [redacted]w[redacted]d  Subjective: Comfortable, feeling some contractions  Objective: BP 132/95  Pulse 76  Temp 98.6 F (37 C) (Oral)  Resp 20  Ht 5\' 2"  (1.575 m)  Wt 95.255 kg (210 lb)  BMI 38.41 kg/m2  LMP 01/04/2011      FHT:  FHR: 130-140 bpm, variability: moderate,  accelerations:  Present,  decelerations:  Present rare variables UC:   regular, every 2-5 minutes SVE:   Dilation: 1 Effacement (%): 50 Station: -2 Exam by:: Tiffany [name],CNM  Labs: Lab Results  Component Value Date   WBC 5.9 01/28/2012   HGB 12.5 01/28/2012   HCT 37.9 01/28/2012   MCV 84.2 01/28/2012   PLT 209 01/28/2012    Assessment / Plan: Induction of labor due to IUGR. Foley bulb attempted but not placed successfully.  Possible SROM around 1430 during attempted foley bulb placement.  Labor: Change to 1 cm after Cytotec x1, Pitocin started. Preeclampsia:  Isolated elevated BP, will monitor Fetal Wellbeing:  Category I Pain Control:  Labor support without medications for now; Fentanyl PRN once pain increases I/D:  PCN Anticipated MOD:  NSVD  Tiffany [name] 01/28/2012, 2:28 PM

## 2012-01-28 NOTE — Progress Notes (Addendum)
Tiffany [name] is a 20 y.o. G2P0010 at [redacted]w[redacted]d  Subjective: Uncomfortable with contractions, but good relief with Fentanyl  Objective: BP 141/84  Pulse 67  Temp 97.6 F (36.4 C) (Oral)  Resp 20  Ht 5\' 2"  (1.575 m)  Wt 95.255 kg (210 lb)  BMI 38.41 kg/m2  LMP 01/04/2011      FHT:  FHR: 135-140 bpm, variability: moderate,  accelerations:  Present,  decelerations:  Present variables, one late around [place] UC:   regular, every 2 minutes SVE:   Dilation: 2 Effacement (%): 80 Station: -2 Exam by:: dr. Robbi [name]  Labs: Lab Results  Component Value Date   WBC 5.9 01/28/2012   HGB 12.5 01/28/2012   HCT 37.9 01/28/2012   MCV 84.2 01/28/2012   PLT 209 01/28/2012    Assessment / Plan: Induction of labor due to IUGR, progressing slowly on Pitocin -Pit stopped 1831 for back-to-back contractions -one late decel around 1845, improved with positioning/small fluid bolus -last few BP readings elevated  -low 140's systolic, low-90's to low-100's diastolic  -some readings during moving around and contractions  Labor: Pit off for now, will recheck cervix in 1-2 hours Preeclampsia:  BP's elevated as above, will check CBC, CMP, and pro/Cr ratio Fetal Wellbeing:  Category II Pain Control:  Fentanyl I/D:  PCN Anticipated MOD:  NSVD  Above discussed with Tiffany [name], CNM  Tiffany [name] 01/28/2012, 6:59 PM

## 2012-01-28 NOTE — Progress Notes (Signed)
S: Doing well, pain well-controlled with controlled breathing techniques.  Pitocin remains off.  Water broke with clear fluid at 2153.  Good fetal movement.   O: Filed Vitals:   01/28/12 1901 01/28/12 2000 01/28/12 2030 01/28/12 2122  BP: 134/91 141/90 144/96 119/80  Pulse: 70 73 84 82  Temp:    98.8 F (37.1 C)  TempSrc:    Axillary  Resp:  18 18 18   Height:      Weight:         FHT:  FHR: 130 bpm, variability: moderate,  accelerations:  Present,  decelerations:  Absent, occasional variables. UC:   regular, every 2-3 minutes SVE:   Dilation: 7 Effacement (%): 80 Station: -2;-3 Exam by:: A.Davis,RN   A / P: Induction of labor due to IUGR GBS positive  Fetal Wellbeing:  Category I Pain Control:  Fentanyl  Anticipated MOD:  NSVD  Raelyn Mora, SNM 01/28/2012, 10:10 PM Supervised by: Thressa Sheller, CNM

## 2012-01-28 NOTE — Progress Notes (Signed)
Tiffany [name] is a 21 y.o. G2P0010 at [redacted]w[redacted]d  Subjective: Comfortable, some back pain with contractions but not much rectal pressure.  Objective: BP 120/76  Pulse 69  Temp 98.6 F (37 C) (Oral)  Resp 20  Ht 5\' 2"  (1.575 m)  Wt 95.255 kg (210 lb)  BMI 38.41 kg/m2  LMP 01/04/2011      FHT:  FHR: 140 bpm, variability: moderate,  accelerations:  Present,  decelerations:  Present occasional variables UC:   regular, every 3-5 minutes SVE:   Dilation: 1.5 Effacement (%): 60 Station: -2 Exam by:: dr. Jamesetta Greenhalgh  Labs: Lab Results  Component Value Date   WBC 5.9 01/28/2012   HGB 12.5 01/28/2012   HCT 37.9 01/28/2012   MCV 84.2 01/28/2012   PLT 209 01/28/2012    Assessment / Plan: Induction of labor due to IUGR, progressing slowly on pitocin  Labor: Continue Pitocin and monitoring; may ambulate with wireless monitor Preeclampsia:  Isolated elevated BP's (?during contractions/movements), no headache/change in vision Fetal Wellbeing:  Category I Pain Control:  Labor support without medications for now; Fentanyl PRN once pain increases I/D:  PCN Anticipated MOD:  NSVD  Above discussed with [name], CNM  Carlee Vonderhaar 01/28/2012, 5:20 PM

## 2012-01-28 NOTE — H&P (Signed)
Attestation of Attending Supervision of Advanced Practitioner (PA/CNM/NP): Evaluation and management procedures were performed by the Advanced Practitioner under my supervision and collaboration.  I have reviewed the Advanced Practitioner's note and chart, and I agree with the management and plan.  Anjulie Dipierro, MD, FACOG Attending Obstetrician & Gynecologist Faculty Practice, [facility]  

## 2012-01-29 ENCOUNTER — Encounter (HOSPITAL_COMMUNITY)
Admission: RE | Disposition: A | Discharge status: Home / Self Care | Payer: Self-pay | Source: Ambulatory Visit | Attending: Obstetrics & Gynecology

## 2012-01-29 ENCOUNTER — Encounter (HOSPITAL_COMMUNITY): Payer: Self-pay | Admitting: Anesthesiology

## 2012-01-29 ENCOUNTER — Inpatient Hospital Stay (HOSPITAL_COMMUNITY): Payer: Medicaid Other | Admitting: Anesthesiology

## 2012-01-29 ENCOUNTER — Inpatient Hospital Stay (HOSPITAL_COMMUNITY): Admission: RE | Admit: 2012-01-29 | Payer: Self-pay | Source: Ambulatory Visit

## 2012-01-29 ENCOUNTER — Encounter (HOSPITAL_COMMUNITY): Payer: Self-pay

## 2012-01-29 DIAGNOSIS — O365990 Maternal care for other known or suspected poor fetal growth, unspecified trimester, not applicable or unspecified: Secondary | ICD-10-CM

## 2012-01-29 DIAGNOSIS — O9989 Other specified diseases and conditions complicating pregnancy, childbirth and the puerperium: Secondary | ICD-10-CM

## 2012-01-29 LAB — PROTEIN / CREATININE RATIO, URINE: Protein Creatinine Ratio: 0.12 (ref 0.00–0.15)

## 2012-01-29 SURGERY — Surgical Case
Anesthesia: Epidural | Site: Abdomen | Wound class: Clean Contaminated

## 2012-01-29 MED ORDER — ONDANSETRON HCL 4 MG/2ML IJ SOLN: 4.0000 mg | Freq: Three times a day (TID) | INTRAMUSCULAR | Status: DC | PRN

## 2012-01-29 MED ORDER — NALOXONE HCL 1 MG/ML IJ SOLN
1.0000 ug/kg/h | INTRAVENOUS | Status: DC | PRN
  Filled 2012-01-29: qty 2

## 2012-01-29 MED ORDER — FENTANYL CITRATE 0.05 MG/ML IJ SOLN
INTRAMUSCULAR | Status: AC
  Filled 2012-01-29: qty 2

## 2012-01-29 MED ORDER — METOCLOPRAMIDE HCL 5 MG/ML IJ SOLN: 10.0000 mg | Freq: Three times a day (TID) | INTRAMUSCULAR | Status: DC | PRN

## 2012-01-29 MED ORDER — CEFAZOLIN SODIUM-DEXTROSE 2-3 GM-% IV SOLR
INTRAVENOUS | Status: AC
  Filled 2012-01-29: qty 50

## 2012-01-29 MED ORDER — KETOROLAC TROMETHAMINE 30 MG/ML IJ SOLN: 30.0000 mg | Freq: Four times a day (QID) | INTRAMUSCULAR | Status: AC | PRN

## 2012-01-29 MED ORDER — KETOROLAC TROMETHAMINE 60 MG/2ML IM SOLN
60.0000 mg | Freq: Once | INTRAMUSCULAR | Status: AC | PRN
  Administered 2012-01-29: 60 mg via INTRAMUSCULAR

## 2012-01-29 MED ORDER — ONDANSETRON HCL 4 MG/2ML IJ SOLN: 4.0000 mg | INTRAMUSCULAR | Status: DC | PRN

## 2012-01-29 MED ORDER — MORPHINE SULFATE (PF) 0.5 MG/ML IJ SOLN: INTRAMUSCULAR | Status: DC | PRN

## 2012-01-29 MED ORDER — SCOPOLAMINE 1 MG/3DAYS TD PT72
MEDICATED_PATCH | TRANSDERMAL | Status: AC
  Administered 2012-01-29: 1.5 mg via TRANSDERMAL
  Filled 2012-01-29: qty 1

## 2012-01-29 MED ORDER — TETANUS-DIPHTH-ACELL PERTUSSIS 5-2.5-18.5 LF-MCG/0.5 IM SUSP
0.5000 mL | Freq: Once | INTRAMUSCULAR | Status: AC
  Administered 2012-01-30: 0.5 mL via INTRAMUSCULAR

## 2012-01-29 MED ORDER — SODIUM CHLORIDE 0.9 % IV SOLN: 250.0000 mL | INTRAVENOUS | Status: DC

## 2012-01-29 MED ORDER — DIPHENHYDRAMINE HCL 50 MG/ML IJ SOLN: 25.0000 mg | INTRAMUSCULAR | Status: DC | PRN

## 2012-01-29 MED ORDER — SODIUM BICARBONATE 8.4 % IV SOLN
INTRAVENOUS | Status: DC | PRN
  Administered 2012-01-29: 5 mL via EPIDURAL

## 2012-01-29 MED ORDER — ONDANSETRON HCL 4 MG/2ML IJ SOLN
INTRAMUSCULAR | Status: AC
  Filled 2012-01-29: qty 2

## 2012-01-29 MED ORDER — OXYTOCIN 40 UNITS IN LACTATED RINGERS INFUSION - SIMPLE MED: 62.5000 mL/h | INTRAVENOUS | Status: AC

## 2012-01-29 MED ORDER — IBUPROFEN 600 MG PO TABS
600.0000 mg | ORAL_TABLET | Freq: Four times a day (QID) | ORAL | Status: DC | PRN
  Filled 2012-01-29 (×6): qty 1

## 2012-01-29 MED ORDER — PHENYLEPHRINE 40 MCG/ML (10ML) SYRINGE FOR IV PUSH (FOR BLOOD PRESSURE SUPPORT)
PREFILLED_SYRINGE | INTRAVENOUS | Status: AC
  Filled 2012-01-29: qty 10

## 2012-01-29 MED ORDER — IBUPROFEN 600 MG PO TABS
600.0000 mg | ORAL_TABLET | Freq: Four times a day (QID) | ORAL | Status: DC
  Administered 2012-01-29 – 2012-01-31 (×8): 600 mg via ORAL
  Filled 2012-01-29 (×2): qty 1

## 2012-01-29 MED ORDER — LACTATED RINGERS IV SOLN
INTRAVENOUS | Status: DC
  Administered 2012-01-29 (×2): via INTRAVENOUS

## 2012-01-29 MED ORDER — DIPHENHYDRAMINE HCL 50 MG/ML IJ SOLN: 12.5000 mg | INTRAMUSCULAR | Status: DC | PRN

## 2012-01-29 MED ORDER — FENTANYL 2.5 MCG/ML BUPIVACAINE 1/10 % EPIDURAL INFUSION (WH - ANES)
14.0000 mL/h | INTRAMUSCULAR | Status: DC
  Administered 2012-01-29: 14 mL/h via EPIDURAL
  Filled 2012-01-29: qty 125

## 2012-01-29 MED ORDER — LACTATED RINGERS IV SOLN
500.0000 mL | Freq: Once | INTRAVENOUS | Status: AC
  Administered 2012-01-29: 500 mL via INTRAVENOUS

## 2012-01-29 MED ORDER — DIPHENHYDRAMINE HCL 50 MG/ML IJ SOLN
12.5000 mg | INTRAMUSCULAR | Status: DC | PRN
  Administered 2012-01-29: 12.5 mg via INTRAVENOUS
  Filled 2012-01-29 (×2): qty 1

## 2012-01-29 MED ORDER — SODIUM CHLORIDE 0.9 % IJ SOLN: 3.0000 mL | Freq: Two times a day (BID) | INTRAMUSCULAR | Status: DC

## 2012-01-29 MED ORDER — SIMETHICONE 80 MG PO CHEW
80.0000 mg | CHEWABLE_TABLET | ORAL | Status: DC | PRN
  Administered 2012-01-29 – 2012-01-30 (×2): 80 mg via ORAL

## 2012-01-29 MED ORDER — SODIUM CHLORIDE 0.9 % IJ SOLN: 3.0000 mL | INTRAMUSCULAR | Status: DC | PRN

## 2012-01-29 MED ORDER — 0.9 % SODIUM CHLORIDE (POUR BTL) OPTIME
TOPICAL | Status: DC | PRN
  Administered 2012-01-29: 200 mL

## 2012-01-29 MED ORDER — LACTATED RINGERS IV SOLN
INTRAVENOUS | Status: DC | PRN
  Administered 2012-01-29 (×2): via INTRAVENOUS

## 2012-01-29 MED ORDER — MENTHOL 3 MG MT LOZG: 1.0000 | LOZENGE | OROMUCOSAL | Status: DC | PRN

## 2012-01-29 MED ORDER — MAGNESIUM HYDROXIDE 400 MG/5ML PO SUSP: 30.0000 mL | ORAL | Status: DC | PRN

## 2012-01-29 MED ORDER — NALOXONE HCL 0.4 MG/ML IJ SOLN: 0.4000 mg | INTRAMUSCULAR | Status: DC | PRN

## 2012-01-29 MED ORDER — ONDANSETRON HCL 4 MG/2ML IJ SOLN
INTRAMUSCULAR | Status: DC | PRN
  Administered 2012-01-29: 4 mg via INTRAVENOUS

## 2012-01-29 MED ORDER — NALBUPHINE HCL 10 MG/ML IJ SOLN
5.0000 mg | INTRAMUSCULAR | Status: DC | PRN
  Filled 2012-01-29: qty 1

## 2012-01-29 MED ORDER — DIPHENHYDRAMINE HCL 25 MG PO CAPS: 25.0000 mg | ORAL_CAPSULE | ORAL | Status: DC | PRN

## 2012-01-29 MED ORDER — PHENYLEPHRINE 40 MCG/ML (10ML) SYRINGE FOR IV PUSH (FOR BLOOD PRESSURE SUPPORT)
80.0000 ug | PREFILLED_SYRINGE | INTRAVENOUS | Status: DC | PRN
  Filled 2012-01-29: qty 5

## 2012-01-29 MED ORDER — MEPERIDINE HCL 25 MG/ML IJ SOLN: 6.2500 mg | INTRAMUSCULAR | Status: DC | PRN

## 2012-01-29 MED ORDER — BUPIVACAINE HCL (PF) 0.5 % IJ SOLN
INTRAMUSCULAR | Status: AC
  Filled 2012-01-29: qty 30

## 2012-01-29 MED ORDER — ZOLPIDEM TARTRATE 5 MG PO TABS: 5.0000 mg | ORAL_TABLET | Freq: Every evening | ORAL | Status: DC | PRN

## 2012-01-29 MED ORDER — MORPHINE SULFATE 0.5 MG/ML IJ SOLN
INTRAMUSCULAR | Status: AC
  Filled 2012-01-29: qty 10

## 2012-01-29 MED ORDER — DIBUCAINE 1 % RE OINT: 1.0000 "application " | TOPICAL_OINTMENT | RECTAL | Status: DC | PRN

## 2012-01-29 MED ORDER — LANOLIN HYDROUS EX OINT: 1.0000 "application " | TOPICAL_OINTMENT | CUTANEOUS | Status: DC | PRN

## 2012-01-29 MED ORDER — EPHEDRINE 5 MG/ML INJ
10.0000 mg | INTRAVENOUS | Status: DC | PRN
  Filled 2012-01-29: qty 4

## 2012-01-29 MED ORDER — OXYTOCIN 10 UNIT/ML IJ SOLN
INTRAMUSCULAR | Status: AC
  Filled 2012-01-29: qty 4

## 2012-01-29 MED ORDER — WITCH HAZEL-GLYCERIN EX PADS: 1.0000 "application " | MEDICATED_PAD | CUTANEOUS | Status: DC | PRN

## 2012-01-29 MED ORDER — EPHEDRINE 5 MG/ML INJ: 10.0000 mg | INTRAVENOUS | Status: DC | PRN

## 2012-01-29 MED ORDER — OXYCODONE-ACETAMINOPHEN 5-325 MG PO TABS
1.0000 | ORAL_TABLET | ORAL | Status: DC | PRN
  Administered 2012-01-30 – 2012-01-31 (×4): 1 via ORAL
  Filled 2012-01-29 (×4): qty 1

## 2012-01-29 MED ORDER — BUPIVACAINE HCL (PF) 0.5 % IJ SOLN
INTRAMUSCULAR | Status: DC | PRN
  Administered 2012-01-29: 30 mL

## 2012-01-29 MED ORDER — SCOPOLAMINE 1 MG/3DAYS TD PT72
1.0000 | MEDICATED_PATCH | Freq: Once | TRANSDERMAL | Status: DC
  Administered 2012-01-29: 1.5 mg via TRANSDERMAL

## 2012-01-29 MED ORDER — FENTANYL CITRATE 0.05 MG/ML IJ SOLN
INTRAMUSCULAR | Status: DC | PRN
  Administered 2012-01-29 (×2): 50 ug via INTRAVENOUS

## 2012-01-29 MED ORDER — MORPHINE SULFATE (PF) 0.5 MG/ML IJ SOLN
INTRAMUSCULAR | Status: DC | PRN
  Administered 2012-01-29: 3.5 mg via EPIDURAL

## 2012-01-29 MED ORDER — SODIUM BICARBONATE 8.4 % IV SOLN
INTRAVENOUS | Status: AC
  Filled 2012-01-29: qty 50

## 2012-01-29 MED ORDER — OXYTOCIN 10 UNIT/ML IJ SOLN
40.0000 [IU] | INTRAVENOUS | Status: DC | PRN
  Administered 2012-01-29: 40 [IU] via INTRAVENOUS

## 2012-01-29 MED ORDER — MORPHINE SULFATE (PF) 0.5 MG/ML IJ SOLN
INTRAMUSCULAR | Status: DC | PRN
Start: 2012-01-29 — End: 2012-01-29
  Administered 2012-01-29: 1.5 mg via EPIDURAL

## 2012-01-29 MED ORDER — FENTANYL CITRATE 0.05 MG/ML IJ SOLN: 25.0000 ug | INTRAMUSCULAR | Status: DC | PRN

## 2012-01-29 MED ORDER — ONDANSETRON HCL 4 MG PO TABS: 4.0000 mg | ORAL_TABLET | ORAL | Status: DC | PRN

## 2012-01-29 MED ORDER — PHENYLEPHRINE 40 MCG/ML (10ML) SYRINGE FOR IV PUSH (FOR BLOOD PRESSURE SUPPORT): 80.0000 ug | PREFILLED_SYRINGE | INTRAVENOUS | Status: DC | PRN

## 2012-01-29 MED ORDER — PRENATAL MULTIVITAMIN CH
1.0000 | ORAL_TABLET | Freq: Every day | ORAL | Status: DC
  Administered 2012-01-29 – 2012-01-31 (×3): 1 via ORAL
  Filled 2012-01-29 (×2): qty 1

## 2012-01-29 MED ORDER — DIPHENHYDRAMINE HCL 25 MG PO CAPS: 25.0000 mg | ORAL_CAPSULE | Freq: Four times a day (QID) | ORAL | Status: DC | PRN

## 2012-01-29 MED ORDER — CEFAZOLIN SODIUM-DEXTROSE 2-3 GM-% IV SOLR
INTRAVENOUS | Status: DC | PRN
  Administered 2012-01-29: 2 g via INTRAVENOUS

## 2012-01-29 MED ORDER — PHENYLEPHRINE HCL 10 MG/ML IJ SOLN
INTRAMUSCULAR | Status: DC | PRN
  Administered 2012-01-29 (×2): 80 ug via INTRAVENOUS
  Administered 2012-01-29 (×2): 120 ug via INTRAVENOUS

## 2012-01-29 MED ORDER — KETOROLAC TROMETHAMINE 60 MG/2ML IM SOLN
INTRAMUSCULAR | Status: AC
  Administered 2012-01-29: 60 mg via INTRAMUSCULAR
  Filled 2012-01-29: qty 2

## 2012-01-29 MED ORDER — LIDOCAINE HCL (PF) 1 % IJ SOLN
INTRAMUSCULAR | Status: DC | PRN
  Administered 2012-01-29: 4 mL
  Administered 2012-01-29: 2 mL
  Administered 2012-01-29 (×2): 4 mL

## 2012-01-29 MED ORDER — LIDOCAINE-EPINEPHRINE (PF) 2 %-1:200000 IJ SOLN
INTRAMUSCULAR | Status: AC
  Filled 2012-01-29: qty 20

## 2012-01-29 MED ORDER — SENNOSIDES-DOCUSATE SODIUM 8.6-50 MG PO TABS
2.0000 | ORAL_TABLET | Freq: Every day | ORAL | Status: DC
  Administered 2012-01-29 – 2012-01-30 (×2): 2 via ORAL

## 2012-01-29 SURGICAL SUPPLY — 34 items
CLOTH BEACON ORANGE TIMEOUT ST (SAFETY) ×2 IMPLANT
DRAPE LG THREE QUARTER DISP (DRAPES) ×2 IMPLANT
DRESSING TELFA 8X3 (GAUZE/BANDAGES/DRESSINGS) IMPLANT
DRSG OPSITE POSTOP 4X10 (GAUZE/BANDAGES/DRESSINGS) ×2 IMPLANT
DURAPREP 26ML APPLICATOR (WOUND CARE) IMPLANT
ELECT REM PT RETURN 9FT ADLT (ELECTROSURGICAL) ×2
ELECTRODE REM PT RTRN 9FT ADLT (ELECTROSURGICAL) ×1 IMPLANT
EXTRACTOR VACUUM M CUP 4 TUBE (SUCTIONS) IMPLANT
GAUZE SPONGE 4X4 12PLY STRL LF (GAUZE/BANDAGES/DRESSINGS) IMPLANT
GLOVE BIO SURGEON STRL SZ7 (GLOVE) ×2 IMPLANT
GLOVE BIOGEL PI IND STRL 7.0 (GLOVE) ×2 IMPLANT
GLOVE BIOGEL PI INDICATOR 7.0 (GLOVE) ×2
GOWN STRL REIN XL XLG (GOWN DISPOSABLE) ×6 IMPLANT
KIT ABG SYR 3ML LUER SLIP (SYRINGE) ×2 IMPLANT
NEEDLE HYPO 22GX1.5 SAFETY (NEEDLE) ×2 IMPLANT
NEEDLE HYPO 25X5/8 SAFETYGLIDE (NEEDLE) ×2 IMPLANT
NS IRRIG 1000ML POUR BTL (IV SOLUTION) ×2 IMPLANT
PACK C SECTION WH (CUSTOM PROCEDURE TRAY) ×2 IMPLANT
PAD ABD 7.5X8 STRL (GAUZE/BANDAGES/DRESSINGS) IMPLANT
PAD OB MATERNITY 4.3X12.25 (PERSONAL CARE ITEMS) ×2 IMPLANT
RTRCTR C-SECT PINK 25CM LRG (MISCELLANEOUS) IMPLANT
SLEEVE SCD COMPRESS KNEE LRG (MISCELLANEOUS) IMPLANT
SLEEVE SCD COMPRESS KNEE MED (MISCELLANEOUS) ×2 IMPLANT
STAPLER VISISTAT 35W (STAPLE) IMPLANT
SUT PDS AB 0 CT1 27 (SUTURE) IMPLANT
SUT PDS AB 0 CTX 36 PDP370T (SUTURE) IMPLANT
SUT VIC AB 0 CT1 36 (SUTURE) ×4 IMPLANT
SUT VIC AB 0 CTX 36 (SUTURE) ×2
SUT VIC AB 0 CTX36XBRD ANBCTRL (SUTURE) ×2 IMPLANT
SUT VIC AB 4-0 KS 27 (SUTURE) ×2 IMPLANT
SYR CONTROL 10ML LL (SYRINGE) ×2 IMPLANT
TOWEL OR 17X24 6PK STRL BLUE (TOWEL DISPOSABLE) ×6 IMPLANT
TRAY FOLEY CATH 14FR (SET/KITS/TRAYS/PACK) IMPLANT
WATER STERILE IRR 1000ML POUR (IV SOLUTION) ×2 IMPLANT

## 2012-01-29 NOTE — OR Nursing (Signed)
75 ml blood loss from fundal massage by DLWegner RN, cord blood x 2 to front desk

## 2012-01-29 NOTE — Progress Notes (Signed)
I have seen the patient with the resident/student and agree with the above.   

## 2012-01-29 NOTE — Progress Notes (Signed)
Dr. Macon Large at bedside, evaluating pt status, discussing her recommendation for a c-section due to lack of cervical progress and FHR decels, explaining risks and benefits of procedure, pt verbalizes understanding and consents to c-section.

## 2012-01-29 NOTE — Progress Notes (Signed)
Dr. Rodman Pickle notified of c-section

## 2012-01-29 NOTE — Progress Notes (Signed)
S: Doing well, sleeping, pain well-controlled with Fentanyl.  Pitocin remains off.  Contractions spacing out.   OCeasar Mons Vitals:   01/28/12 2131 01/28/12 2234 01/28/12 2301 01/29/12 0002  BP: 129/90 128/58 109/51 120/68  Pulse: 80 92 86 85  Temp:      TempSrc:      Resp:  20 18 20   Height:      Weight:         FHT:  FHR: 130 bpm, variability: moderate,  accelerations:  Present,  decelerations:  Absent, variables. UC:   irregular, every 2-4 minutes SVE:   Dilation: 7 Effacement (%): 70 Station: -2 Exam by:: A.Davis,RN   A / P: Induction of labor due to IUGR No labor progress GBS positive  Fetal Wellbeing:  Category I Pain Control:  Fentanyl Restart Pitocin at 2mU Re-evaluate labor progress in 1-2 hours Anticipated MOD:  NSVD  Tiffany [name], SNM 01/29/2012, 12:25 AM Supervised by: Thressa Sheller, CNM

## 2012-01-29 NOTE — Anesthesia Preprocedure Evaluation (Signed)
Anesthesia Evaluation  Patient identified by MRN, date of birth, ID band Patient awake    Reviewed: Allergy & Precautions, H&P , NPO status , Patient's Chart, lab work & pertinent test results, reviewed documented beta blocker date and time   History of Anesthesia Complications Negative for: history of anesthetic complications  Airway Mallampati: II TM Distance: >3 FB Neck ROM: full    Dental  (+) Teeth Intact   Pulmonary neg pulmonary ROS,  breath sounds clear to auscultation        Cardiovascular negative cardio ROS  Rhythm:regular Rate:Normal     Neuro/Psych PSYCHIATRIC DISORDERS (bipolar, anxiety, depression) negative neurological ROS     GI/Hepatic negative GI ROS, Neg liver ROS,   Endo/Other  negative endocrine ROS  Renal/GU negative Renal ROS     Musculoskeletal   Abdominal   Peds  Hematology negative hematology ROS (+)   Anesthesia Other Findings   Reproductive/Obstetrics (+) Pregnancy                           Anesthesia Physical Anesthesia Plan  ASA: II  Anesthesia Plan: Epidural   Post-op Pain Management:    Induction:   Airway Management Planned:   Additional Equipment:   Intra-op Plan:   Post-operative Plan:   Informed Consent: I have reviewed the patients History and Physical, chart, labs and discussed the procedure including the risks, benefits and alternatives for the proposed anesthesia with the patient or authorized representative who has indicated his/her understanding and acceptance.     Plan Discussed with:   Anesthesia Plan Comments:         Anesthesia Quick Evaluation

## 2012-01-29 NOTE — Progress Notes (Signed)
Attending Note  Called to evaluate patient with concerning repetitive FHR decelerations and failure of cervical dilation past 7 cm for several hours despite different interventions.  Patient has undergone different intrauterine neonatal resuscitation interventions which have not helped significantly in improving the FHR tracing.  On exam, she is 7 cm dilated, cervix is diffusely edematous, fetal head has significant molding and it is hard to ascertain position, seems asynclitic.  Given all these factors, cesarean section was recommended.  The risks of cesarean section discussed with the patient included but were not limited to: bleeding which may require transfusion or reoperation; infection which may require antibiotics; injury to bowel, bladder, ureters or other surrounding organs; injury to the fetus; need for additional procedures including hysterectomy in the event of a life-threatening hemorrhage; placental abnormalities wth subsequent pregnancies, incisional problems, thromboembolic phenomenon and other postoperative/anesthesia complications. The patient concurred with the proposed plan, giving informed written consent for the procedure.   Anesthesia and OR aware.  Preoperative prophylactic antibiotics and SCDs ordered on call to the OR.  To OR when ready.   Jaynie Collins, MD, FACOG Attending Obstetrician & Gynecologist Faculty Practice, [facility]

## 2012-01-29 NOTE — Anesthesia Postprocedure Evaluation (Signed)
  Anesthesia Post-op Note  Patient: Tiffany [name]  Procedure(s) Performed: Procedure(s) (LRB) with comments: CESAREAN SECTION (N/A) - primary cesarean section of baby girl at 27 APGAR 8/9  Patient Location: PACU  Anesthesia Type:Epidural  Level of Consciousness: awake, alert  and oriented  Airway and Oxygen Therapy: Patient Spontanous Breathing  Post-op Pain: none  Post-op Assessment: Post-op Vital signs reviewed, Patient's Cardiovascular Status Stable, Respiratory Function Stable, Patent Airway, No signs of Nausea or vomiting, Pain level controlled, No headache, No backache, No residual numbness and No residual motor weakness  Post-op Vital Signs: Reviewed and stable  Complications: No apparent anesthesia complications

## 2012-01-29 NOTE — Anesthesia Postprocedure Evaluation (Signed)
  Anesthesia Post-op Note  Patient: Janielle M [place]  Procedure(s) Performed: Procedure(s) (LRB) with comments: CESAREAN SECTION (N/A) - primary cesarean section of baby girl at 0520 APGAR 8/9  Patient Location: Mother/Baby  Anesthesia Type:Epidural  Level of Consciousness: awake, alert  and oriented  Airway and Oxygen Therapy: Patient Spontanous Breathing  Post-op Pain: mild  Post-op Assessment: Post-op Vital signs reviewed, Patient's Cardiovascular Status Stable, No headache, No backache, No residual numbness and No residual motor weakness  Post-op Vital Signs: Reviewed and stable  Complications: No apparent anesthesia complications 

## 2012-01-29 NOTE — Anesthesia Postprocedure Evaluation (Signed)
  Anesthesia Post-op Note  Patient: Tiffany [name]  Procedure(s) Performed: Procedure(s) (LRB) with comments: CESAREAN SECTION (N/A) - primary cesarean section of baby girl at 91 APGAR 8/9  Patient Location: Mother/Baby  Anesthesia Type:Epidural  Level of Consciousness: awake, alert  and oriented  Airway and Oxygen Therapy: Patient Spontanous Breathing  Post-op Pain: mild  Post-op Assessment: Post-op Vital signs reviewed, Patient's Cardiovascular Status Stable, No headache, No backache, No residual numbness and No residual motor weakness  Post-op Vital Signs: Reviewed and stable  Complications: No apparent anesthesia complications

## 2012-01-29 NOTE — Op Note (Signed)
Tiffany [name] PROCEDURE DATE: 01/28/2012 - 01/29/2012  PREOPERATIVE DIAGNOSIS: Intrauterine pregnancy at  [redacted]w[redacted]d weeks gestation; repetitive fetal heart rate decelerations; failure of cervical dilatation  POSTOPERATIVE DIAGNOSIS: The same  PROCEDURE: Primary Low Transverse Cesarean Section  SURGEON:  Dr. Jaynie Collins  ANESTHESIOLOGIST: Dana Allan, MD  INDICATIONS: Tiffany [name] is a 21 y.o. G2P0010 at [redacted]w[redacted]d here for cesarean section secondary to the indications listed under preoperative diagnosis; please see preoperative note for further details.  The risks of cesarean section were discussed with the patient including but were not limited to: bleeding which may require transfusion or reoperation; infection which may require antibiotics; injury to bowel, bladder, ureters or other surrounding organs; injury to the fetus; need for additional procedures including hysterectomy in the event of a life-threatening hemorrhage; placental abnormalities wth subsequent pregnancies, incisional problems, thromboembolic phenomenon and other postoperative/anesthesia complications.   The patient concurred with the proposed plan, giving informed written consent for the procedure.    FINDINGS:  Viable female infant in cephalic presentation.  Tight nuchal cord x 2, reduced.  Vacuum assistance used to to deliver the infant's head. Apgars 8 and 9, arterial cord pH 7.21.  Clear amniotic fluid.  Intact placenta, three vessel cord.  Normal uterus, fallopian tubes and ovaries bilaterally.  ANESTHESIA: Epidural INTRAVENOUS FLUIDS: 1200 ml ESTIMATED BLOOD LOSS: 600 ml URINE OUTPUT:  50 ml SPECIMENS: Placenta sent to pathology COMPLICATIONS: None immediate  PROCEDURE IN DETAIL:  The patient preoperatively received intravenous antibiotics and had sequential compression devices applied to her lower extremities.  She was then taken to the operating room where the epidural anesthesia was dosed up to  surgical level and was found to be adequate. She was then placed in a dorsal supine position with a leftward tilt, and prepped and draped in a sterile manner.  A foley catheter was placed into her bladder and attached to constant gravity.  After an adequate timeout was performed, a Pfannenstiel skin incision was made with scalpel and carried through to the underlying layer of fascia. The fascia was incised in the midline, and this incision was extended bilaterally bluntly.  The rectus muscles were separated in the midline bluntly and the peritoneum was entered bluntly.  Attention was turned to the lower uterine segment where a low transverse hysterotomy was made with a scalpel and extended bilaterally bluntly.  The infant was successfully delivered, with vacuum assistance,  the cord was clamped and cut and the infant was handed over to awaiting neonatology team. Uterine massage was then administered, and the placenta delivered intact with a three-vessel cord. The uterus was then cleared of clot and debris.  The hysterotomy was closed with 0 Vicryl in a running locked fashion, and an imbricating layer was also placed with 0 Vicryl. The pelvis was cleared of all clot and debris. Hemostasis was confirmed on all surfaces.  The peritoneum and the muscles were reapproximated using 0 Vicryl interrupted stitches. The fascia was then closed using 0 Vicryl in a running fashion.  The subcutaneous layer was irrigated, and the skin was closed with a 4-0 Vicryl subcuticular stitch. 30 ml of 0.5% Marcaine was injected subcutaneously around the incision. The patient tolerated the procedure well. Sponge, lap, instrument and needle counts were correct x 2.  She was taken to the recovery room in stable condition.

## 2012-01-29 NOTE — Anesthesia Procedure Notes (Signed)
Epidural Patient location during procedure: OB Start time: 01/29/2012 2:16 AM  Staffing Performed by: anesthesiologist   Preanesthetic Checklist Completed: patient identified, site marked, surgical consent, pre-op evaluation, timeout performed, IV checked, risks and benefits discussed and monitors and equipment checked  Epidural Patient position: sitting Prep: site prepped and draped and DuraPrep Patient monitoring: continuous pulse ox and blood pressure Approach: midline Injection technique: LOR air  Needle:  Needle type: Tuohy  Needle gauge: 17 G Needle length: 9 cm and 9 Needle insertion depth: 5.5 cm Catheter type: closed end flexible Catheter size: 19 Gauge Catheter at skin depth: 10.5 cm Test dose: negative  Assessment Events: blood not aspirated, injection not painful, no injection resistance, negative IV test and no paresthesia  Additional Notes Discussed risk of headache, infection, bleeding, nerve injury and failed or incomplete block.  Patient voices understanding and wishes to proceed.  Epidural placed easily on first attempt.  Patient tolerated procedure well with no apparent complications.  Jasmine December, MDReason for block:procedure for pain

## 2012-01-29 NOTE — Addendum Note (Signed)
Addendum  created 01/29/12 2208 by Graciela Husbands, CRNA   Modules edited:Notes Section

## 2012-01-29 NOTE — Addendum Note (Signed)
Addendum  created 01/29/12 2209 by Graciela Husbands, CRNA   Modules edited:Notes Section

## 2012-01-29 NOTE — Transfer of Care (Signed)
Immediate Anesthesia Transfer of Care Note  Patient: Tiffany [name]  Procedure(s) Performed: Procedure(s) (LRB) with comments: CESAREAN SECTION (N/A) - primary cesarean section of baby girl at 62 APGAR 8/9  Patient Location: PACU  Anesthesia Type:Epidural  Level of Consciousness: awake, alert  and oriented  Airway & Oxygen Therapy: Patient Spontanous Breathing  Post-op Assessment: Report given to PACU RN and Post -op Vital signs reviewed and stable  Post vital signs: stable  Complications: No apparent anesthesia complications

## 2012-01-29 NOTE — Progress Notes (Signed)
Gilbert Narain Donaway is a 21 y.o. G2P0010 at [redacted]w[redacted]d  Subjective: Coping well with contractions. Starting to feel increased pressure.   Objective: BP 137/92  Pulse 94  Temp 98.4 F (36.9 C) (Axillary)  Resp 20  Ht 5\' 2"  (1.575 m)  Wt 95.255 kg (210 lb)  BMI 38.41 kg/m2  LMP 01/04/2011      FHT:  FHR: 140 bpm, variability: moderate,  accelerations:  Present,  decelerations:  Present Variable. UC:   regular, every 3 minutes SVE:   Dilation: 7 Effacement (%): 70 Station: -2 Exam by:: Arita Miss, SNM  Labs: Lab Results  Component Value Date   WBC 6.8 01/28/2012   HGB 12.4 01/28/2012   HCT 38.2 01/28/2012   MCV 84.5 01/28/2012   PLT 197 01/28/2012    Assessment / Plan: Arrest in active phase of labor  Pt was first checked at 2153 and found to be 7cm. She was checked again at around midnight and was 7cm. Pitocin was restarted, and Dr. Macon Large notified of patients arrest of dilation. She is now still 7cm. IUPC placed to confirm adequacy of contractions.   Labor: IUPC placed. Increase pitocin for adequate MVU's Preeclampsia:  NA Fetal Wellbeing:  Category I Pain Control:  Labor support without medications I/D:  n/a Anticipated MOD:  NSVD  Tawnya Crook 01/29/2012, 1:35 AM

## 2012-01-29 NOTE — Progress Notes (Signed)
Tiffany [name] is a 21 y.o. G2P0010 at [redacted]w[redacted]d  Subjective:  Comfortable with epidural Objective: BP 146/65  Pulse 113  Temp 99.4 F (37.4 C) (Axillary)  Resp 18  Ht 5\' 2"  (1.575 m)  Wt 95.255 kg (210 lb)  BMI 38.41 kg/m2  LMP 01/04/2011      FHT:  FHR: 150 bpm, variability: moderate,  accelerations:  Abscent,  decelerations:  Present Variables UC:   regular, every 2-5 minutes SVE:   Dilation: 7 Effacement (%): 70 Station: -1 Exam by:: Mathews Robinsons, RN  Labs: Lab Results  Component Value Date   WBC 6.8 01/28/2012   HGB 12.4 01/28/2012   HCT 38.2 01/28/2012   MCV 84.5 01/28/2012   PLT 197 01/28/2012    Assessment / Plan: Arrest in active phase of labor  Labor: Dr. Macon Large notified of patient status Preeclampsia:  NA Fetal Wellbeing:  Category II Pain Control:  Epidural I/D:  n/a Anticipated MOD:  Uncertain  Tawnya Crook 01/29/2012, 4:19 AM

## 2012-01-30 ENCOUNTER — Encounter (HOSPITAL_COMMUNITY): Payer: Self-pay | Admitting: Obstetrics & Gynecology

## 2012-01-30 LAB — CBC
HCT: 27.3 % — ABNORMAL LOW (ref 36.0–46.0)
MCH: 28.3 pg (ref 26.0–34.0)
MCHC: 33 g/dL (ref 30.0–36.0)
MCV: 85.8 fL (ref 78.0–100.0)
RDW: 14 % (ref 11.5–15.5)

## 2012-01-30 NOTE — Progress Notes (Signed)
UR completed 

## 2012-01-30 NOTE — Progress Notes (Signed)
Subjective: Postpartum Day 1: Cesarean Delivery Patient reports tolerating PO, + flatus and no problems voiding.    Objective: Vital signs in last 24 hours: Temp:  [97.1 F (36.2 C)-98.9 F (37.2 C)] 98.3 F (36.8 C) (12/17 0543) Pulse Rate:  [79-109] 86  (12/17 0543) Resp:  [16-20] 20  (12/17 0543) BP: (103-135)/(53-88) 119/69 mmHg (12/17 0543) SpO2:  [96 %-100 %] 98 % (12/17 0543)  Physical Exam:  General: alert, cooperative and no distress Lochia: appropriate Uterine Fundus: firm Incision: healing well DVT Evaluation: No evidence of DVT seen on physical exam.   Basename 01/30/12 0515 01/28/12 1928  HGB 9.0* 12.4  HCT 27.3* 38.2    Assessment/Plan: Status post Cesarean section. Doing well postoperatively.  Continue current care. Pt will breastfeeding and bottle feeding. Birth control: pills  Governor Specking 01/30/2012, 7:19 AM

## 2012-01-31 MED ORDER — OXYCODONE-ACETAMINOPHEN 5-325 MG PO TABS: 1.0000 | ORAL_TABLET | ORAL | Status: DC | PRN

## 2012-01-31 MED ORDER — IBUPROFEN 600 MG PO TABS: 600.0000 mg | ORAL_TABLET | Freq: Four times a day (QID) | ORAL | Status: DC | PRN

## 2012-01-31 NOTE — Progress Notes (Signed)
I saw and examined patient and agree with above.  Will treat anemia with ferrous sulfate. Napoleon Form, MD

## 2012-01-31 NOTE — Progress Notes (Signed)
Clinical Social Work Department PSYCHOSOCIAL ASSESSMENT - MATERNAL/CHILD 01/31/2012  Patient:  Tiffany, [name]  Account Number:  [id number]  Admit Date:  01/28/2012  Tiffany [name] Name:   "Tiffany [name]" (spelling may be  off)    Clinical Social Worker:  [name], [place]   Date/Time:  01/31/2012 01:33 PM  Date Referred:  01/31/2012   Referral source  Physician     Referred reason  [facility]   Other referral source:    I:  FAMILY / HOME ENVIRONMENT Child's legal guardian:  PARENT  Guardian - Name Guardian - Age Guardian - Address  Tiffany [name] [address] [place] Ace  Tiffany [name]  same   Other household support members/support persons Name Relationship DOB  Mother in [place]    extended family     Other support:    II  PSYCHOSOCIAL DATA Information Source:  Patient Interview  Event organiser Employment:   Financial resources:  [employer] If Medicaid - County:  [employer] / Grade:   Maternity Care Coordinator / Child Services Coordination / Early Interventions:  Cultural issues impacting care:    III  STRENGTHS Strengths  Adequate Resources  Home prepared for Child (including basic supplies)  Supportive family/friends   Strength comment:    IV  RISK FACTORS AND CURRENT PROBLEMS Current Problem:  YES   Risk Factor & Current Problem Patient Issue Family Issue Risk Factor / Current Problem Comment  Mental Illness Y Y [name] with history of Bipolar d/o, anxiety & depression    V  SOCIAL WORK ASSESSMENT CSW met with patient to discuss her [facility]- per her report, she received PNC in [place] at 7 weeks and when she moved here she had a lapse in [facility] of a few months (EPIC states no PNC from week 16-31) [name] claims it was week 20- and that the lapse was due to her records not being sent timely- CSW advised her of the [facility] policy to check newborns for drugs when there is NPNC or LPNC. [name] became upset/offended and explaining why she had lapse to  which CSW voiced hearing- CSW then reiterated the policy and what she can expect. [name] was quite resistant to further questions but states she has all needed supplies at home as well as lots of support and assitance from family/friends.      VI SOCIAL WORK PLAN Social Work Plan  Other   Type of pt/family education:   PPD- [name] has history of depression, anxiety and Bipolar D/O- she states she does not require any RX's at this time- discussed PPD and signs/symptoms to watch for post pardum- she acknoweldges understanding.  She denies any substance abuse/use and denies narcotic abuse-   If child protective services report - county:   If child protective services report - date:   Information/referral to community resources comment:   CPS if drug screen is positive   Other social work plan:   Await baby drug screen for further f/u needs    [name], MSW, [employer] [phone]

## 2012-01-31 NOTE — Discharge Summary (Signed)
Obstetric Discharge Summary Reason for Admission: induction of labor Prenatal Procedures: none Intrapartum Procedures: cesarean: low cervical, transverse and GBS prophylaxis Postpartum Procedures: none Complications-Operative and Postpartum: none Hemoglobin  Date Value Range Status  01/30/2012 9.0* 12.0 - 15.0 g/dL Final     DELTA CHECK NOTED     REPEATED TO VERIFY     HCT  Date Value Range Status  01/30/2012 27.3* 36.0 - 46.0 % Final   Brief Hospital Course: Pt was admitted for induction of labor for IUGR, treated prophylactically with PCN for GBS positive status. Pt was taken to c-section for arrest of labor in active phase. She had no immediate complications post-op.  Physical Exam:  General: alert, cooperative and no distress Lochia: appropriate Uterine Fundus: firm Incision: Dressing in place, clean/dry DVT Evaluation: No evidence of DVT seen on physical exam.  Discharge Diagnoses: Term Pregnancy-delivered (c-section) and Failed induction  Discharge Information: Date: 01/31/2012 Activity: pelvic rest Diet: routine Medications: PNV, Ibuprofen and Percocet Condition: stable Instructions: refer to practice specific booklet Discharge to: home Follow-up Information    Follow up with [facility]. Schedule an appointment as soon as possible for a visit in 4 weeks.   Contact information:   [address] [place] [address] [phone]         Newborn Data: Live born female  Birth Weight: 6 lb 5.8 oz (2885 g) APGAR: 8, 9  Home with mother. Breast feeding. Planning minipill for contraception.  Street, Christopher 01/31/2012, 7:41 AM  I have seen and examined this patient and I agree with the above. Cam Hai 8:58 AM 01/31/2012

## 2012-02-01 NOTE — Discharge Summary (Signed)
Attestation of Attending Supervision of Advanced Practitioner (CNM/NP): Evaluation and management procedures were performed by the Advanced Practitioner under my supervision and collaboration.  I have reviewed the Advanced Practitioner's note and chart, and I agree with the management and plan.  Cambelle Suchecki 02/01/2012 9:51 AM   

## 2012-02-02 ENCOUNTER — Emergency Department (HOSPITAL_COMMUNITY)
Admission: EM | Admit: 2012-02-02 | Discharge: 2012-02-03 | Disposition: A | Discharge status: Home / Self Care | Payer: Medicaid Other | Attending: Emergency Medicine | Admitting: Emergency Medicine

## 2012-02-02 ENCOUNTER — Encounter (HOSPITAL_COMMUNITY): Payer: Self-pay | Admitting: Emergency Medicine

## 2012-02-02 DIAGNOSIS — R059 Cough, unspecified: Secondary | ICD-10-CM | POA: Insufficient documentation

## 2012-02-02 DIAGNOSIS — Z8719 Personal history of other diseases of the digestive system: Secondary | ICD-10-CM | POA: Insufficient documentation

## 2012-02-02 DIAGNOSIS — R05 Cough: Secondary | ICD-10-CM | POA: Insufficient documentation

## 2012-02-02 DIAGNOSIS — Z8659 Personal history of other mental and behavioral disorders: Secondary | ICD-10-CM | POA: Insufficient documentation

## 2012-02-02 DIAGNOSIS — R42 Dizziness and giddiness: Secondary | ICD-10-CM | POA: Insufficient documentation

## 2012-02-02 DIAGNOSIS — R509 Fever, unspecified: Secondary | ICD-10-CM | POA: Insufficient documentation

## 2012-02-02 NOTE — ED Notes (Signed)
Patient reports had cesarean section on Monday. Reports started running fever today and feels lightheaded.

## 2012-02-03 ENCOUNTER — Emergency Department (HOSPITAL_COMMUNITY): Payer: Medicaid Other

## 2012-02-03 LAB — BASIC METABOLIC PANEL
Calcium: 8.6 mg/dL (ref 8.4–10.5)
GFR calc Af Amer: >90 mL/min (ref >90)
GFR calc non Af Amer: 81 mL/min — ABNORMAL LOW (ref >90)
Potassium: 3.7 mEq/L (ref 3.5–5.1)
Sodium: 137 mEq/L (ref 135–145)

## 2012-02-03 LAB — CBC WITH DIFFERENTIAL/PLATELET
Basophils Relative: 0 % (ref 0–1)
Eosinophils Absolute: 0.1 10*3/uL (ref 0.0–0.7)
Eosinophils Relative: 2 % (ref 0–5)
MCH: 28.5 pg (ref 26.0–34.0)
MCHC: 33.8 g/dL (ref 30.0–36.0)
MCV: 84.1 fL (ref 78.0–100.0)
Neutrophils Relative %: 69 % (ref 43–77)
Platelets: 207 10*3/uL (ref 150–400)
RDW: 13.3 % (ref 11.5–15.5)

## 2012-02-03 LAB — URINE MICROSCOPIC-ADD ON

## 2012-02-03 LAB — URINALYSIS, ROUTINE W REFLEX MICROSCOPIC
Bilirubin Urine: NEGATIVE
Nitrite: NEGATIVE
Protein, ur: NEGATIVE mg/dL
Specific Gravity, Urine: 1.015 (ref 1.005–1.030)
Urobilinogen, UA: 0.2 mg/dL (ref 0.0–1.0)

## 2012-02-03 MED ORDER — ACETAMINOPHEN 325 MG PO TABS
ORAL_TABLET | ORAL | Status: AC
  Administered 2012-02-03: 650 mg
  Filled 2012-02-03: qty 1

## 2012-02-03 MED ORDER — SODIUM CHLORIDE 0.9 % IV BOLUS (SEPSIS)
500.0000 mL | Freq: Once | INTRAVENOUS | Status: AC
  Administered 2012-02-03: 01:00:00 via INTRAVENOUS

## 2012-02-03 MED ORDER — AZITHROMYCIN 250 MG PO TABS: ORAL_TABLET | ORAL | Status: DC

## 2012-02-03 MED ORDER — SODIUM CHLORIDE 0.9 % IV SOLN: Freq: Once | INTRAVENOUS | Status: DC

## 2012-02-03 MED ORDER — GUAIFENESIN-CODEINE 100-10 MG/5ML PO SOLN
5.0000 mL | Freq: Once | ORAL | Status: AC
  Administered 2012-02-03: 5 mL via ORAL
  Filled 2012-02-03: qty 5

## 2012-02-03 NOTE — ED Provider Notes (Signed)
History     CSN: [id number]  Arrival date & time 02/02/12  2045   First MD Initiated Contact with Patient 02/03/12 0017      Chief Complaint  Patient presents with  . Fever  . Dizziness    (Consider location/radiation/quality/duration/timing/severity/associated sxs/prior treatment) HPI  Tiffany [name] is a 21 y.o. female who presents to the Emergency Department complaining of  Fever, lightheadedness and cough that began today. She had a C section delivery 01/29/12 and had been doing well until this evening when she began to have a low grade fever, felt a little dizzy an developed a cough. She is breastfeeding. Denies chills, nausea, vomiting, diarrhea, urinary frequency or urgency, shortness of breath or chest pain.   Past Medical History  Diagnosis Date  . Anxiety   . Hemorrhoids   . Mental disorder     bipolar  . Depression     Past Surgical History  Procedure Date  . No past surgeries   . Cesarean section 01/29/2012    Procedure: CESAREAN SECTION;  Surgeon: Tereso Newcomer, MD;  Location: [facility];  Service: Obstetrics;  Laterality: N/A;  primary cesarean section of baby girl at 83 APGAR 8/9    Family History  Problem Relation Age of Onset  . Other Neg Hx   . Diabetes Mother     History  Substance Use Topics  . Smoking status: Never Smoker   . Smokeless tobacco: Never Used  . Alcohol Use: No    OB History    Grav Para Term Preterm Abortions TAB SAB Ect Mult Living   2 1 1  1  1   1       Review of Systems  Constitutional: Positive for fever.       10 Systems reviewed and are negative for acute change except as noted in the HPI.  HENT: Negative for congestion.   Eyes: Negative for discharge and redness.  Respiratory: Positive for cough. Negative for shortness of breath.   Cardiovascular: Negative for chest pain.  Gastrointestinal: Negative for vomiting and abdominal pain.  Musculoskeletal: Negative for back pain.  Skin: Negative for rash.   Neurological: Positive for light-headedness. Negative for syncope, numbness and headaches.  Psychiatric/Behavioral:       No behavior change.    Allergies  Mushroom extract complex; Cherry; and Strawberry  Home Medications   Current Outpatient Rx  Name  Route  Sig  Dispense  Refill  . AZITHROMYCIN 250 MG PO TABS      Take one daily   4 tablet   0   . IBUPROFEN 600 MG PO TABS   Oral   Take 1 tablet (600 mg total) by mouth every 6 (six) hours as needed.   30 tablet   2   . OXYCODONE-ACETAMINOPHEN 5-325 MG PO TABS   Oral   Take 1-2 tablets by mouth every 4 (four) hours as needed (moderate - severe pain).   30 tablet   0   . PRENATAL MULTIVITAMIN CH   Oral   Take 1 tablet by mouth every morning.           BP 127/94  Pulse 101  Temp 99.9 F (37.7 C) (Oral)  Resp 18  Ht 5\' 2"  (1.575 m)  Wt 200 lb (90.719 kg)  BMI 36.58 kg/m2  SpO2 98%  LMP 02/03/2011  Breastfeeding? Yes  Physical Exam  Nursing note and vitals reviewed. Constitutional: She is oriented to person, place, and time. She appears well-developed  and well-nourished.       Awake, alert, nontoxic appearance.  HENT:  Head: Atraumatic.  Eyes: Right eye exhibits no discharge. Left eye exhibits no discharge.  Neck: Neck supple.  Cardiovascular: Normal heart sounds.   Pulmonary/Chest: Effort normal and breath sounds normal. She exhibits no tenderness.       Dry cough  Abdominal: Soft. There is no tenderness. There is no rebound.       C section site clean and dry. No erythema.   Musculoskeletal: She exhibits no tenderness.       Baseline ROM, no obvious new focal weakness.  Neurological: She is alert and oriented to person, place, and time.       Mental status and motor strength appears baseline for patient and situation.  Skin: No rash noted.  Psychiatric: She has a normal mood and affect.    ED Course  Procedures (including critical care time) Results for orders placed during the hospital  encounter of 02/02/12  CBC WITH DIFFERENTIAL      Component Value Range   WBC 4.9  4.0 - 10.5 K/uL   RBC 3.97  3.87 - 5.11 MIL/uL   Hemoglobin 11.3 (*) 12.0 - 15.0 g/dL   HCT 16.1 (*) 09.6 - 04.5 %   MCV 84.1  78.0 - 100.0 fL   MCH 28.5  26.0 - 34.0 pg   MCHC 33.8  30.0 - 36.0 g/dL   RDW 40.9  81.1 - 91.4 %   Platelets 207  150 - 400 K/uL   Neutrophils Relative 69  43 - 77 %   Neutro Abs 3.4  1.7 - 7.7 K/uL   Lymphocytes Relative 20  12 - 46 %   Lymphs Abs 1.0  0.7 - 4.0 K/uL   Monocytes Relative 10  3 - 12 %   Monocytes Absolute 0.5  0.1 - 1.0 K/uL   Eosinophils Relative 2  0 - 5 %   Eosinophils Absolute 0.1  0.0 - 0.7 K/uL   Basophils Relative 0  0 - 1 %   Basophils Absolute 0.0  0.0 - 0.1 K/uL  BASIC METABOLIC PANEL      Component Value Range   Sodium 137  135 - 145 mEq/L   Potassium 3.7  3.5 - 5.1 mEq/L   Chloride 104  96 - 112 mEq/L   CO2 24  19 - 32 mEq/L   Glucose, Bld 85  70 - 99 mg/dL   BUN 12  6 - 23 mg/dL   Creatinine, Ser 7.82  0.50 - 1.10 mg/dL   Calcium 8.6  8.4 - 95.6 mg/dL   GFR calc non Af Amer 81 (*) >90 mL/min   GFR calc Af Amer >90  >90 mL/min  URINALYSIS, ROUTINE W REFLEX MICROSCOPIC      Component Value Range   Color, Urine YELLOW  YELLOW   APPearance CLEAR  CLEAR   Specific Gravity, Urine 1.015  1.005 - 1.030   pH 7.0  5.0 - 8.0   Glucose, UA NEGATIVE  NEGATIVE mg/dL   Hgb urine dipstick MODERATE (*) NEGATIVE   Bilirubin Urine NEGATIVE  NEGATIVE   Ketones, ur TRACE (*) NEGATIVE mg/dL   Protein, ur NEGATIVE  NEGATIVE mg/dL   Urobilinogen, UA 0.2  0.0 - 1.0 mg/dL   Nitrite NEGATIVE  NEGATIVE   Leukocytes, UA NEGATIVE  NEGATIVE  URINE MICROSCOPIC-ADD ON      Component Value Range   WBC, UA 0-2  <3 WBC/hpf   RBC /  HPF 7-10  <3 RBC/hpf   Bacteria, UA FEW (*) RARE    Dg Chest 2 View  02/03/2012  *RADIOLOGY REPORT*  Clinical Data: Fever, dizziness.  CHEST - 2 VIEW  Comparison: None.  Findings: Lungs are clear. No pleural effusion or  pneumothorax. The cardiomediastinal contours are within normal limits. The visualized bones and soft tissues are without significant appreciable abnormality.  IMPRESSION: No radiographic evidence of acute cardiopulmonary process.   Original Report Authenticated By: Jearld Lesch, M.D.      1. Fever   2. Cough     0115 Orthostatic vitals normal.   MDM  Patient presents with fever, cough and dizziness. Orthostatics negative. Given IVF, albuterol nebulizer treatment and cough syrup. Fever responded to tylenol. Labs and chest xray unremarkable. Reviewed results with patient and her husband.  Pt feels improved after observation and/or treatment in ED.Pt stable in ED with no significant deterioration in condition.The patient appears reasonably screened and/or stabilized for discharge and I doubt any other medical condition or other [facility] requiring further screening, evaluation, or treatment in the ED at this time prior to discharge.  MDM Reviewed: nursing note and vitals Interpretation: labs and x-ray           Nicoletta Dress. Colon Branch, MD 02/03/12 [phone]

## 2012-03-23 ENCOUNTER — Emergency Department (HOSPITAL_COMMUNITY)
Admission: EM | Admit: 2012-03-23 | Discharge: 2012-03-23 | Disposition: A | Discharge status: Home / Self Care | Payer: Medicaid Other | Attending: Emergency Medicine | Admitting: Emergency Medicine

## 2012-03-23 ENCOUNTER — Encounter (HOSPITAL_COMMUNITY): Payer: Self-pay | Admitting: Emergency Medicine

## 2012-03-23 DIAGNOSIS — Z79899 Other long term (current) drug therapy: Secondary | ICD-10-CM | POA: Insufficient documentation

## 2012-03-23 DIAGNOSIS — K625 Hemorrhage of anus and rectum: Secondary | ICD-10-CM

## 2012-03-23 DIAGNOSIS — Z8659 Personal history of other mental and behavioral disorders: Secondary | ICD-10-CM | POA: Insufficient documentation

## 2012-03-23 DIAGNOSIS — Z8719 Personal history of other diseases of the digestive system: Secondary | ICD-10-CM | POA: Insufficient documentation

## 2012-03-23 NOTE — ED Provider Notes (Signed)
History     CSN: [id number]  Arrival date & time 03/23/12  1939   First MD Initiated Contact with Patient 03/23/12 2013      Chief Complaint  Patient presents with  . Rectal Bleeding    (Consider location/radiation/quality/duration/timing/severity/associated sxs/prior treatment) Patient is a 22 y.o. female presenting with hematochezia. The history is provided by the patient (the pt complains of rectal bleeding). No language interpreter was used.  Rectal Bleeding  The current episode started 2 days ago. The problem occurs frequently. The problem has been unchanged. The pain is mild. The stool is described as soft. There was no prior successful therapy. There was no prior unsuccessful therapy. Pertinent negatives include no abdominal pain, no diarrhea, no hematuria, no chest pain, no headaches, no coughing and no rash.    Past Medical History  Diagnosis Date  . Anxiety   . Hemorrhoids   . Mental disorder     bipolar  . Depression     Past Surgical History  Procedure Laterality Date  . No past surgeries    . Cesarean section  01/29/2012    Procedure: CESAREAN SECTION;  Surgeon: Tereso Newcomer, MD;  Location: [facility];  Service: Obstetrics;  Laterality: N/A;  primary cesarean section of baby girl at 21 APGAR 8/9    Family History  Problem Relation Age of Onset  . Other Neg Hx   . Diabetes Mother     History  Substance Use Topics  . Smoking status: Never Smoker   . Smokeless tobacco: Never Used  . Alcohol Use: No    OB History   Grav Para Term Preterm Abortions TAB SAB Ect Mult Living   2 1 1  1  1   1       Review of Systems  Constitutional: Negative for fatigue.  HENT: Negative for congestion, sinus pressure and ear discharge.   Eyes: Negative for discharge.  Respiratory: Negative for cough.   Cardiovascular: Negative for chest pain.  Gastrointestinal: Positive for hematochezia and anal bleeding. Negative for abdominal pain and diarrhea.  Genitourinary:  Negative for frequency and hematuria.  Musculoskeletal: Negative for back pain.  Skin: Negative for rash.  Neurological: Negative for seizures and headaches.  Psychiatric/Behavioral: Negative for hallucinations.    Allergies  Mushroom extract complex; Cherry; and Strawberry  Home Medications   Current Outpatient Rx  Name  Route  Sig  Dispense  Refill  . azithromycin (ZITHROMAX Z-PAK) 250 MG tablet      Take one daily   4 tablet   0   . ibuprofen (ADVIL,MOTRIN) 600 MG tablet   Oral   Take 1 tablet (600 mg total) by mouth every 6 (six) hours as needed.   30 tablet   2   . oxyCODONE-acetaminophen (PERCOCET/ROXICET) 5-325 MG per tablet   Oral   Take 1-2 tablets by mouth every 4 (four) hours as needed (moderate - severe pain).   30 tablet   0   . Prenatal Vit-Fe Fumarate-FA (PRENATAL MULTIVITAMIN) TABS   Oral   Take 1 tablet by mouth every morning.           BP 132/81  Pulse 78  Temp(Src) 97.9 F (36.6 C) (Oral)  Resp 14  Ht 5\' 2"  (1.575 m)  Wt 187 lb (84.823 kg)  BMI 34.19 kg/m2  SpO2 100%  LMP 03/22/2012  Physical Exam  Constitutional: She is oriented to person, place, and time. She appears well-developed.  HENT:  Head: Normocephalic and atraumatic.  Eyes:  Conjunctivae and EOM are normal. No scleral icterus.  Neck: Neck supple. No thyromegaly present.  Cardiovascular: Normal rate and regular rhythm.  Exam reveals no gallop and no friction rub.   No murmur heard. Pulmonary/Chest: No stridor. She has no wheezes. She has no rales. She exhibits no tenderness.  Abdominal: She exhibits no distension. There is no tenderness. There is no rebound.  Genitourinary:  No external hem.  Tender internal anus.  Hem pos  Musculoskeletal: Normal range of motion. She exhibits no edema.  Lymphadenopathy:    She has no cervical adenopathy.  Neurological: She is oriented to person, place, and time. Coordination normal.  Skin: No rash noted. No erythema.  Psychiatric: She  has a normal mood and affect. Her behavior is normal.    ED Course  Procedures (including critical care time)  Labs Reviewed - No data to display No results found.   1. Rectal bleeding       MDM          Benny Lennert, MD 03/23/12 2030

## 2012-03-23 NOTE — ED Notes (Signed)
Patient complaining of rectal bleeding that started 3 days ago. Report history of hemorrhoid that hasn't bothered her for a while.

## 2012-06-15 ENCOUNTER — Emergency Department (HOSPITAL_COMMUNITY)
Admission: EM | Admit: 2012-06-15 | Discharge: 2012-06-15 | Discharge status: Against Medical Advice | Payer: Medicaid Other

## 2012-06-15 ENCOUNTER — Other Ambulatory Visit (HOSPITAL_COMMUNITY): Payer: Self-pay | Admitting: Pulmonary Disease

## 2012-06-15 ENCOUNTER — Ambulatory Visit (HOSPITAL_COMMUNITY)
Admission: RE | Admit: 2012-06-15 | Discharge: 2012-06-15 | Disposition: A | Discharge status: Home / Self Care | Payer: Medicaid Other | Source: Ambulatory Visit | Attending: Pulmonary Disease | Admitting: Pulmonary Disease

## 2012-06-15 DIAGNOSIS — Z111 Encounter for screening for respiratory tuberculosis: Secondary | ICD-10-CM | POA: Insufficient documentation

## 2012-06-15 DIAGNOSIS — R7611 Nonspecific reaction to tuberculin skin test without active tuberculosis: Secondary | ICD-10-CM

## 2012-07-04 ENCOUNTER — Ambulatory Visit: Payer: Self-pay | Admitting: Adult Health

## 2012-07-11 ENCOUNTER — Encounter: Payer: Medicaid Other | Admitting: Adult Health

## 2012-07-11 ENCOUNTER — Encounter: Payer: Self-pay | Admitting: Adult Health

## 2012-07-12 NOTE — Progress Notes (Signed)
This encounter was created in error - please disregard.  This encounter was created in error - please disregard.

## 2012-07-17 ENCOUNTER — Encounter: Payer: Self-pay | Admitting: *Deleted

## 2012-07-18 ENCOUNTER — Ambulatory Visit: Payer: Medicaid Other | Admitting: Adult Health

## 2012-08-01 ENCOUNTER — Ambulatory Visit (INDEPENDENT_AMBULATORY_CARE_PROVIDER_SITE_OTHER): Payer: Medicaid Other | Admitting: Adult Health

## 2012-08-01 ENCOUNTER — Encounter: Payer: Self-pay | Admitting: Adult Health

## 2012-08-01 VITALS — BP 120/70 | Ht 62.0 in | Wt 185.0 lb

## 2012-08-01 DIAGNOSIS — N898 Other specified noninflammatory disorders of vagina: Secondary | ICD-10-CM

## 2012-08-01 DIAGNOSIS — Q849 Congenital malformation of integument, unspecified: Secondary | ICD-10-CM

## 2012-08-01 DIAGNOSIS — N760 Acute vaginitis: Secondary | ICD-10-CM | POA: Insufficient documentation

## 2012-08-01 DIAGNOSIS — B9689 Other specified bacterial agents as the cause of diseases classified elsewhere: Secondary | ICD-10-CM

## 2012-08-01 DIAGNOSIS — A499 Bacterial infection, unspecified: Secondary | ICD-10-CM

## 2012-08-01 DIAGNOSIS — N6489 Other specified disorders of breast: Secondary | ICD-10-CM

## 2012-08-01 HISTORY — DX: Other specified bacterial agents as the cause of diseases classified elsewhere: B96.89

## 2012-08-01 LAB — POCT WET PREP (WET MOUNT): WBC, Wet Prep HPF POC: POSITIVE

## 2012-08-01 MED ORDER — METRONIDAZOLE 0.75 % VA GEL: VAGINAL | Status: DC

## 2012-08-01 MED ORDER — CLINDAMYCIN PHOSPHATE 100 MG VA SUPP: 100.0000 mg | Freq: Every day | VAGINAL | Status: DC

## 2012-08-01 NOTE — Progress Notes (Signed)
Subjective:     Patient ID: Tiffany [name], female   DOB: 11-29-1990, 22 y.o.   MRN: [id number]  HPI Tiffany [name] is a 22 year old black female having had bleeding since getting IUD in Feb 2014, still spots, and she says boyfriend cheated.And also beast un equal in size,she says partner could feel IUD. She says she sweats more now.   Review of Systems Patient denies any headaches, blurred vision, shortness of breath, chest pain, abdominal pain, problems with bowel movements, urination, or intercourse. No joint pain or mood changes recently.   Reviewed past medical,surgical, social and family history. Reviewed medications and allergies.  Objective:   Physical Exam BP 120/70  Ht 5\' 2"  (1.575 m)  Wt 185 lb (83.915 kg)  BMI 33.83 kg/m2  LMP 07/24/2012  Breastfeeding? Yes Skin warm and dry.Pelvic: external genitalia is normal in appearance, vagina: white discharge with odor, cervix:smooth and bulbous, IUD seen and shortened, uterus: normal size, shape and contour, non tender, no masses felt, adnexa: no masses or tenderness noted. Wet prep: + for clue cells and +WBCs. GC/CHL obtained.  Left breast about 1 cup larger than right, tried to reassure for now.    Assessment:      Vaginal discharge BV Asymmetric breast    Plan:      Rx Clindamycin 100 mg supp x 3 nights-discussed with Dr. Despina Hidden No sex   Return in 4 weeks for pap and physical.

## 2012-08-01 NOTE — Patient Instructions (Addendum)
Bacterial Vaginosis Bacterial vaginosis (BV) is a vaginal infection where the normal balance of bacteria in the vagina is disrupted. The normal balance is then replaced by an overgrowth of certain bacteria. There are several different kinds of bacteria that can cause BV. BV is the most common vaginal infection in women of childbearing age. CAUSES   The cause of BV is not fully understood. BV develops when there is an increase or imbalance of harmful bacteria.  Some activities or behaviors can upset the normal balance of bacteria in the vagina and put women at increased risk including:  Having a new sex partner or multiple sex partners.  Douching.  Using an intrauterine device (IUD) for contraception.  It is not clear what role sexual activity plays in the development of BV. However, women that have never had sexual intercourse are rarely infected with BV. Women do not get BV from toilet seats, bedding, swimming pools or from touching objects around them.  SYMPTOMS   Grey vaginal discharge.  A fish-like odor with discharge, especially after sexual intercourse.  Itching or burning of the vagina and vulva.  Burning or pain with urination.  Some women have no signs or symptoms at all. DIAGNOSIS  Your caregiver must examine the vagina for signs of BV. Your caregiver will perform lab tests and look at the sample of vaginal fluid through a microscope. They will look for bacteria and abnormal cells (clue cells), a pH test higher than 4.5, and a positive amine test all associated with BV.  RISKS AND COMPLICATIONS   Pelvic inflammatory disease (PID).  Infections following gynecology surgery.  Developing HIV.  Developing herpes virus. TREATMENT  Sometimes BV will clear up without treatment. However, all women with symptoms of BV should be treated to avoid complications, especially if gynecology surgery is planned. Female partners generally do not need to be treated. However, BV may spread  between female sex partners so treatment is helpful in preventing a recurrence of BV.   BV may be treated with antibiotics. The antibiotics come in either pill or vaginal cream forms. Either can be used with nonpregnant or pregnant women, but the recommended dosages differ. These antibiotics are not harmful to the baby.  BV can recur after treatment. If this happens, a second round of antibiotics will often be prescribed.  Treatment is important for pregnant women. If not treated, BV can cause a premature delivery, especially for a pregnant woman who had a premature birth in the past. All pregnant women who have symptoms of BV should be checked and treated.  For chronic reoccurrence of BV, treatment with a type of prescribed gel vaginally twice a week is helpful. HOME CARE INSTRUCTIONS   Finish all medication as directed by your caregiver.  Do not have sex until treatment is completed.  Tell your sexual partner that you have a vaginal infection. They should see their caregiver and be treated if they have problems, such as a mild rash or itching.  Practice safe sex. Use condoms. Only have 1 sex partner. PREVENTION  Basic prevention steps can help reduce the risk of upsetting the natural balance of bacteria in the vagina and developing BV:  Do not have sexual intercourse (be abstinent).  Do not douche.  Use all of the medicine prescribed for treatment of BV, even if the signs and symptoms go away.  Tell your sex partner if you have BV. That way, they can be treated, if needed, to prevent reoccurrence. SEEK MEDICAL CARE IF:     Your symptoms are not improving after 3 days of treatment.  You have increased discharge, pain, or fever. MAKE SURE YOU:   Understand these instructions.  Will watch your condition.  Will get help right away if you are not doing well or get worse. FOR MORE INFORMATION  Division of STD Prevention (DSTDP), Centers for Disease Control and Prevention:  [employer] American Social Health Association (ASHA): www.ashastd.org  Document Released: 01/30/2005 Document Revised: 04/24/2011 Document Reviewed: 07/23/2008 [facility] Patient Information 2014 [place], [place]. No sex Return in 4 weeks for pap

## 2012-08-02 ENCOUNTER — Telehealth: Payer: Self-pay | Admitting: Adult Health

## 2012-08-02 LAB — GC/CHLAMYDIA PROBE AMP: GC Probe RNA: NEGATIVE

## 2012-08-02 NOTE — Telephone Encounter (Signed)
Spoke with pt. GC/CHL was negative. Pt aware. JSY

## 2012-09-16 ENCOUNTER — Other Ambulatory Visit: Payer: Medicaid Other | Admitting: Adult Health

## 2012-10-05 ENCOUNTER — Emergency Department (HOSPITAL_COMMUNITY)
Admission: EM | Admit: 2012-10-05 | Discharge: 2012-10-05 | Disposition: A | Discharge status: Home / Self Care | Payer: Medicaid Other | Attending: Emergency Medicine | Admitting: Emergency Medicine

## 2012-10-05 ENCOUNTER — Emergency Department (HOSPITAL_COMMUNITY): Payer: Medicaid Other

## 2012-10-05 ENCOUNTER — Encounter (HOSPITAL_COMMUNITY): Payer: Self-pay | Admitting: *Deleted

## 2012-10-05 DIAGNOSIS — Z79899 Other long term (current) drug therapy: Secondary | ICD-10-CM | POA: Insufficient documentation

## 2012-10-05 DIAGNOSIS — Z975 Presence of (intrauterine) contraceptive device: Secondary | ICD-10-CM | POA: Insufficient documentation

## 2012-10-05 DIAGNOSIS — R300 Dysuria: Secondary | ICD-10-CM | POA: Insufficient documentation

## 2012-10-05 DIAGNOSIS — N760 Acute vaginitis: Secondary | ICD-10-CM | POA: Insufficient documentation

## 2012-10-05 DIAGNOSIS — Z8659 Personal history of other mental and behavioral disorders: Secondary | ICD-10-CM | POA: Insufficient documentation

## 2012-10-05 DIAGNOSIS — Z8679 Personal history of other diseases of the circulatory system: Secondary | ICD-10-CM | POA: Insufficient documentation

## 2012-10-05 DIAGNOSIS — R102 Pelvic and perineal pain: Secondary | ICD-10-CM

## 2012-10-05 DIAGNOSIS — B9689 Other specified bacterial agents as the cause of diseases classified elsewhere: Secondary | ICD-10-CM

## 2012-10-05 DIAGNOSIS — Z3202 Encounter for pregnancy test, result negative: Secondary | ICD-10-CM | POA: Insufficient documentation

## 2012-10-05 DIAGNOSIS — R110 Nausea: Secondary | ICD-10-CM | POA: Insufficient documentation

## 2012-10-05 DIAGNOSIS — N949 Unspecified condition associated with female genital organs and menstrual cycle: Secondary | ICD-10-CM | POA: Insufficient documentation

## 2012-10-05 LAB — WET PREP, GENITAL
Trich, Wet Prep: NONE SEEN
Yeast Wet Prep HPF POC: NONE SEEN

## 2012-10-05 LAB — CBC WITH DIFFERENTIAL/PLATELET
Basophils Relative: 0 % (ref 0–1)
Eosinophils Absolute: 0 10*3/uL (ref 0.0–0.7)
MCH: 27.9 pg (ref 26.0–34.0)
MCHC: 33.4 g/dL (ref 30.0–36.0)
Neutrophils Relative %: 69 % (ref 43–77)
Platelets: 209 10*3/uL (ref 150–400)
RBC: 4.48 MIL/uL (ref 3.87–5.11)

## 2012-10-05 LAB — COMPREHENSIVE METABOLIC PANEL
ALT: 10 U/L (ref 0–35)
Albumin: 3.6 g/dL (ref 3.5–5.2)
Alkaline Phosphatase: 63 U/L (ref 39–117)
Potassium: 3.7 mEq/L (ref 3.5–5.1)
Sodium: 141 mEq/L (ref 135–145)
Total Protein: 7 g/dL (ref 6.0–8.3)

## 2012-10-05 LAB — URINALYSIS, ROUTINE W REFLEX MICROSCOPIC
Ketones, ur: NEGATIVE mg/dL
Leukocytes, UA: NEGATIVE
Nitrite: POSITIVE — AB
Protein, ur: NEGATIVE mg/dL
pH: 6 (ref 5.0–8.0)

## 2012-10-05 LAB — URINE MICROSCOPIC-ADD ON

## 2012-10-05 LAB — LIPASE, BLOOD: Lipase: 33 U/L (ref 11–59)

## 2012-10-05 MED ORDER — OXYCODONE-ACETAMINOPHEN 5-325 MG PO TABS: 2.0000 | ORAL_TABLET | ORAL | Status: DC | PRN

## 2012-10-05 MED ORDER — ONDANSETRON HCL 4 MG/2ML IJ SOLN
4.0000 mg | Freq: Once | INTRAMUSCULAR | Status: AC
  Administered 2012-10-05: 4 mg via INTRAVENOUS

## 2012-10-05 MED ORDER — ONDANSETRON HCL 4 MG/2ML IJ SOLN
INTRAMUSCULAR | Status: AC
  Filled 2012-10-05: qty 2

## 2012-10-05 MED ORDER — METRONIDAZOLE 500 MG PO TABS
2000.0000 mg | ORAL_TABLET | Freq: Once | ORAL | Status: AC
  Administered 2012-10-05: 2000 mg via ORAL
  Filled 2012-10-05: qty 4

## 2012-10-05 MED ORDER — ONDANSETRON HCL 4 MG/2ML IJ SOLN
4.0000 mg | Freq: Once | INTRAMUSCULAR | Status: AC
  Administered 2012-10-05: 4 mg via INTRAVENOUS
  Filled 2012-10-05: qty 2

## 2012-10-05 MED ORDER — AZITHROMYCIN 250 MG PO TABS
1000.0000 mg | ORAL_TABLET | Freq: Once | ORAL | Status: AC
  Administered 2012-10-05: 1000 mg via ORAL
  Filled 2012-10-05: qty 4

## 2012-10-05 MED ORDER — HYDROMORPHONE HCL PF 1 MG/ML IJ SOLN
1.0000 mg | Freq: Once | INTRAMUSCULAR | Status: AC
  Administered 2012-10-05: 1 mg via INTRAVENOUS
  Filled 2012-10-05: qty 1

## 2012-10-05 MED ORDER — SODIUM CHLORIDE 0.9 % IV BOLUS (SEPSIS)
1000.0000 mL | Freq: Once | INTRAVENOUS | Status: AC
  Administered 2012-10-05: 1000 mL via INTRAVENOUS

## 2012-10-05 MED ORDER — DEXTROSE 5 % IV SOLN
1.0000 g | Freq: Once | INTRAVENOUS | Status: AC
  Administered 2012-10-05: 1 g via INTRAVENOUS
  Filled 2012-10-05: qty 10

## 2012-10-05 NOTE — ED Provider Notes (Signed)
CSN: [id number]     Arrival date & time 10/05/12  4098 History  This chart was scribed for Hilario Quarry, MD by Ardelia Mems, ED Scribe. This patient was seen in room APA11/APA11 and the patient's care was started at 8:31 AM.    Chief Complaint  Patient presents with  . Abdominal Pain  . Dysuria    Patient is a 22 y.o. female presenting with abdominal pain and dysuria. The history is provided by the patient. No language interpreter was used.  Abdominal Pain Pain location:  RLQ and RUQ Pain quality: sharp   Pain radiates to:  Does not radiate Pain severity:  Severe Onset quality:  Sudden Duration:  1 day Timing:  Constant Progression:  Worsening Chronicity:  New Relieved by: mild relief with Ibuprofen. Worsened by:  Palpation Ineffective treatments:  None tried Associated symptoms: dysuria and nausea   Associated symptoms: no chills, no cough, no diarrhea, no fever, no hematuria, no vaginal discharge and no vomiting   Dysuria:    Severity:  Moderate   Onset quality:  Sudden   Duration:  1 day   Timing:  Constant   Progression:  Unchanged   Chronicity:  New Nausea:    Severity:  Moderate   Onset quality:  Sudden   Duration:  1 day   Timing:  Constant   Progression:  Unchanged Dysuria Pain quality:  Burning Pain severity:  Moderate Onset quality:  Sudden Duration:  1 day Timing:  Constant Progression:  Unchanged Chronicity:  New Relieved by:  None tried Worsened by:  Nothing tried Ineffective treatments:  None tried Urinary symptoms: no hematuria   Associated symptoms: abdominal pain and nausea   Associated symptoms: no fever, no vaginal discharge and no vomiting     HPI Comments: TEHILLA COFFEL is a 22 y.o. Female with a history of BV who presents to the Emergency Department complaining of sudden onset, constant, severe "10/10, sharp" RLQ abdominal pain and "6/10" RUQ abdominal onset yesterday. She reports associated nausea and dysuria onset yesterday. She  states that yesterday she took Ibuprofen with mild relief of this pain, but that when the medication wore off, she began experiencing pelvic pain in addition to her RLQ and RUQ pain. Her pain is worsened with palpation. She denies having a history of similar pain. She states that she is otherwise healthy with mo chronic medical conditions. She states that she had a normal pap smear and pelvic exam about 2 moths ago. She reports that she delivered a child by C-section in December 2013, and she has had Mirena IUD in place since January 2014. She is currently breastfeeding. She reports having 1 sexual partner. She denies fever, chills, emesis, diarrhea, vaginal discharge or any other symptoms. She denies any history of smoking and denies alcohol use.     Past Medical History  Diagnosis Date  . Anxiety   . Hemorrhoids   . Mental disorder     bipolar  . Depression   . BV (bacterial vaginosis) 08/01/2012   Past Surgical History  Procedure Laterality Date  . No past surgeries    . Cesarean section  01/29/2012    Procedure: CESAREAN SECTION;  Surgeon: Tereso Newcomer, MD;  Location: [facility];  Service: Obstetrics;  Laterality: N/A;  primary cesarean section of baby girl at 106 APGAR 8/9   Family History  Problem Relation Age of Onset  . Other Neg Hx   . Diabetes Mother    History  Substance Use  Topics  . Smoking status: Never Smoker   . Smokeless tobacco: Never Used  . Alcohol Use: No   OB History   Grav Para Term Preterm Abortions TAB SAB Ect Mult Living   2 1 1  1  1   1      Review of Systems  Constitutional: Negative for fever and chills.  Respiratory: Negative for cough.   Gastrointestinal: Positive for nausea and abdominal pain. Negative for vomiting and diarrhea.  Genitourinary: Positive for dysuria and pelvic pain. Negative for hematuria and vaginal discharge.  All other systems reviewed and are negative.    Allergies  Mushroom extract complex; Cherry; and  Strawberry  Home Medications   Current Outpatient Rx  Name  Route  Sig  Dispense  Refill  . levonorgestrel (MIRENA) 20 MCG/24HR IUD   Intrauterine   1 each by Intrauterine route once.         . Prenatal Vit-Fe Fumarate-FA (PRENATAL MULTIVITAMIN) TABS   Oral   Take 1 tablet by mouth every morning.         . clindamycin (CLEOCIN) 100 MG vaginal suppository   Vaginal   Place 1 suppository (100 mg total) vaginally at bedtime.   3 suppository   0    Triage Vitals: BP 96/69  Pulse 96  Temp(Src) 98.6 F (37 C) (Oral)  Resp 16  Ht 5\' 2"  (1.575 m)  Wt 180 lb (81.647 kg)  BMI 32.91 kg/m2  SpO2 99%  Physical Exam  Nursing note and vitals reviewed. Constitutional: She is oriented to person, place, and time. She appears well-developed and well-nourished.  HENT:  Head: Normocephalic and atraumatic.  Mouth/Throat: Oropharynx is clear and moist.  Eyes: Conjunctivae and EOM are normal.  Neck: Normal range of motion. Neck supple. No tracheal deviation present.  Cardiovascular: Normal rate, regular rhythm and normal heart sounds.   Pulmonary/Chest: Effort normal and breath sounds normal. No respiratory distress.  Abdominal: Soft. Bowel sounds are normal. There is tenderness (Severe RLQ tenderness to palpation, moderate RUQ tenderness to palpation.).  Genitourinary:  Pelvic exam- Injected conjunctiva on the left. Uterus feels normal in size. Bilateral tenderness to palpation of cervix.  Musculoskeletal: Normal range of motion. She exhibits no tenderness.  Neurological: She is alert and oriented to person, place, and time.  Skin: Skin is warm and dry. No rash noted.  Psychiatric: She has a normal mood and affect.    ED Course   Medications  cefTRIAXone (ROCEPHIN) 1 g in dextrose 5 % 50 mL IVPB (1 g Intravenous New Bag/Given 10/05/12 1050)  HYDROmorphone (DILAUDID) injection 1 mg (1 mg Intravenous Given 10/05/12 0927)  ondansetron (ZOFRAN) injection 4 mg (4 mg Intravenous Given  10/05/12 0927)  sodium chloride 0.9 % bolus 1,000 mL (1,000 mLs Intravenous New Bag/Given 10/05/12 0926)  azithromycin (ZITHROMAX) tablet 1,000 mg (1,000 mg Oral Given 10/05/12 1050)   Procedures (including critical care time)  DIAGNOSTIC STUDIES: Oxygen Saturation is 99% on RA, normal by my interpretation.    COORDINATION OF CARE: 9:00 AM- Performed pelvic exam. Pt advised of plan for diagnostic lab work and radiology, along with plan to receive Dilaudid, Zofran and IV fluids in the ED and pt agrees.   Labs Reviewed  WET PREP, GENITAL - Abnormal; Notable for the following:    WBC, Wet Prep HPF POC FEW (*)    All other components within normal limits  URINALYSIS, ROUTINE W REFLEX MICROSCOPIC - Abnormal; Notable for the following:    APPearance HAZY (*)  Specific Gravity, Urine >1.030 (*)    Hgb urine dipstick SMALL (*)    Nitrite POSITIVE (*)    All other components within normal limits  COMPREHENSIVE METABOLIC PANEL - Abnormal; Notable for the following:    Glucose, Bld 101 (*)    All other components within normal limits  URINE MICROSCOPIC-ADD ON - Abnormal; Notable for the following:    Squamous Epithelial / LPF FEW (*)    Bacteria, UA MANY (*)    All other components within normal limits  GC/CHLAMYDIA PROBE AMP  URINE CULTURE  PREGNANCY, URINE  CBC WITH DIFFERENTIAL  LIPASE, BLOOD   US Abdomen Complete  10/05/2012   *RADIOLOGY REPORT*  Clinical Data:  Abdominal pain  ULTRASOUND ABDOMEN:  Technique:  Sonography of upper abdominal structures was performed.  Comparison:  None  Gallbladder:  Contracted limiting assessment (patient ate 3 hours prior to exam).  No obvious gallstones, wall thickening, or sonographic Murphy's sign.  No pericholecystic fluid.  Common bile duct:  4 mm diameter, normal  Liver:  Normal appearance  IVC:  Normal appearance  Pancreas:  Normal appearance  Spleen:  Normal appearance, 6.2 cm length  Right kidney:  10.6 cm length. Normal morphology without mass  or hydronephrosis.  Left kidney:  10.3 cm length. Normal morphology without mass or hydronephrosis.  Aorta:  Normal caliber  Other:  No free fluid  IMPRESSION: Contracted gallbladder, question related to food intake 3 hours before exam. Otherwise negative upper abdominal ultrasound.   Original Report Authenticated By: Ulyses Southward, M.D.    No diagnosis found.  MDM   Results for orders placed during the hospital encounter of 10/05/12  WET PREP, GENITAL      Result Value Range   Yeast Wet Prep HPF POC NONE SEEN  NONE SEEN   Trich, Wet Prep NONE SEEN  NONE SEEN   Clue Cells Wet Prep HPF POC NONE SEEN  NONE SEEN   WBC, Wet Prep HPF POC FEW (*) NONE SEEN  URINALYSIS, ROUTINE W REFLEX MICROSCOPIC      Result Value Range   Color, Urine YELLOW  YELLOW   APPearance HAZY (*) CLEAR   Specific Gravity, Urine >1.030 (*) 1.005 - 1.030   pH 6.0  5.0 - 8.0   Glucose, UA NEGATIVE  NEGATIVE mg/dL   Hgb urine dipstick SMALL (*) NEGATIVE   Bilirubin Urine NEGATIVE  NEGATIVE   Ketones, ur NEGATIVE  NEGATIVE mg/dL   Protein, ur NEGATIVE  NEGATIVE mg/dL   Urobilinogen, UA 0.2  0.0 - 1.0 mg/dL   Nitrite POSITIVE (*) NEGATIVE   Leukocytes, UA NEGATIVE  NEGATIVE  PREGNANCY, URINE      Result Value Range   Preg Test, Ur NEGATIVE  NEGATIVE  CBC WITH DIFFERENTIAL      Result Value Range   WBC 7.2  4.0 - 10.5 K/uL   RBC 4.48  3.87 - 5.11 MIL/uL   Hemoglobin 12.5  12.0 - 15.0 g/dL   HCT 40.9  81.1 - 91.4 %   MCV 83.5  78.0 - 100.0 fL   MCH 27.9  26.0 - 34.0 pg   MCHC 33.4  30.0 - 36.0 g/dL   RDW 78.2  95.6 - 21.3 %   Platelets 209  150 - 400 K/uL   Neutrophils Relative % 69  43 - 77 %   Neutro Abs 5.0  1.7 - 7.7 K/uL   Lymphocytes Relative 20  12 - 46 %   Lymphs Abs 1.5  0.7 -  4.0 K/uL   Monocytes Relative 10  3 - 12 %   Monocytes Absolute 0.7  0.1 - 1.0 K/uL   Eosinophils Relative 1  0 - 5 %   Eosinophils Absolute 0.0  0.0 - 0.7 K/uL   Basophils Relative 0  0 - 1 %   Basophils Absolute 0.0  0.0 -  0.1 K/uL  COMPREHENSIVE METABOLIC PANEL      Result Value Range   Sodium 141  135 - 145 mEq/L   Potassium 3.7  3.5 - 5.1 mEq/L   Chloride 108  96 - 112 mEq/L   CO2 24  19 - 32 mEq/L   Glucose, Bld 101 (*) 70 - 99 mg/dL   BUN 13  6 - 23 mg/dL   Creatinine, Ser 1.61  0.50 - 1.10 mg/dL   Calcium 9.1  8.4 - 09.6 mg/dL   Total Protein 7.0  6.0 - 8.3 g/dL   Albumin 3.6  3.5 - 5.2 g/dL   AST 13  0 - 37 U/L   ALT 10  0 - 35 U/L   Alkaline Phosphatase 63  39 - 117 U/L   Total Bilirubin 0.3  0.3 - 1.2 mg/dL   GFR calc non Af Amer >90  >90 mL/min   GFR calc Af Amer >90  >90 mL/min  LIPASE, BLOOD      Result Value Range   Lipase 33  11 - 59 U/L  URINE MICROSCOPIC-ADD ON      Result Value Range   Squamous Epithelial / LPF FEW (*) RARE   WBC, UA 3-6  <3 WBC/hpf   RBC / HPF 3-6  <3 RBC/hpf   Bacteria, UA MANY (*) RARE   US Abdomen Complete  10/05/2012   *RADIOLOGY REPORT*  Clinical Data:  Abdominal pain  ULTRASOUND ABDOMEN:  Technique:  Sonography of upper abdominal structures was performed.  Comparison:  None  Gallbladder:  Contracted limiting assessment (patient ate 3 hours prior to exam).  No obvious gallstones, wall thickening, or sonographic Murphy's sign.  No pericholecystic fluid.  Common bile duct:  4 mm diameter, normal  Liver:  Normal appearance  IVC:  Normal appearance  Pancreas:  Normal appearance  Spleen:  Normal appearance, 6.2 cm length  Right kidney:  10.6 cm length. Normal morphology without mass or hydronephrosis.  Left kidney:  10.3 cm length. Normal morphology without mass or hydronephrosis.  Aorta:  Normal caliber  Other:  No free fluid  IMPRESSION: Contracted gallbladder, question related to food intake 3 hours before exam. Otherwise negative upper abdominal ultrasound.   Original Report Authenticated By: Ulyses Southward, M.D.   US Transvaginal Non-ob  10/05/2012   CLINICAL DATA:  Pelvic pain. History of intrauterine device.  EXAM: TRANSABDOMINAL AND TRANSVAGINAL ULTRASOUND OF  PELVIS  TECHNIQUE: Both transabdominal and transvaginal ultrasound examinations of the pelvis were performed. Transabdominal technique was performed for global imaging of the pelvis including uterus, ovaries, adnexal regions, and pelvic cul-de-sac. It was necessary to proceed with endovaginal exam following the transabdominal exam to visualize the uterus and endometrium.  COMPARISON:  11/08/2010  FINDINGS: Uterus:  Measures 8.3 x 4.3 x 5.2 cm, anteverted.  Endometrium: Measures 9 mm in thickness, with IUD in lower uterine segment with stem tip in the vicinity of the internal os.  Right ovary: Measures 3.6 x 2.1 x 3.2 cm and appears normal.  Left ovary: Measures 3.5 x 2.1 x 2.5 cm and appears normal.  Other findings:  No free fluid  IMPRESSION: 1.  Abnormal low position of the intrauterine device. It is located in the lower uterine segment with stem potentially extending and into the internal cervical os. Removal and replacement recommended.   Electronically Signed   By: Herbie Baltimore   On: 10/05/2012 11:49   US Pelvis Complete  10/05/2012   CLINICAL DATA:  Pelvic pain. History of intrauterine device.  EXAM: TRANSABDOMINAL AND TRANSVAGINAL ULTRASOUND OF PELVIS  TECHNIQUE: Both transabdominal and transvaginal ultrasound examinations of the pelvis were performed. Transabdominal technique was performed for global imaging of the pelvis including uterus, ovaries, adnexal regions, and pelvic cul-de-sac. It was necessary to proceed with endovaginal exam following the transabdominal exam to visualize the uterus and endometrium.  COMPARISON:  11/08/2010  FINDINGS: Uterus:  Measures 8.3 x 4.3 x 5.2 cm, anteverted.  Endometrium: Measures 9 mm in thickness, with IUD in lower uterine segment with stem tip in the vicinity of the internal os.  Right ovary: Measures 3.6 x 2.1 x 3.2 cm and appears normal.  Left ovary: Measures 3.5 x 2.1 x 2.5 cm and appears normal.  Other findings:  No free fluid  IMPRESSION: 1. Abnormal low  position of the intrauterine device. It is located in the lower uterine segment with stem potentially extending and into the internal cervical os. Removal and replacement recommended.   Electronically Signed   By: Herbie Baltimore   On: 10/05/2012 11:49    Patient treated here for possible std withrocephin and vanc.  Patient with no evidence of pelvic abscess and she is well appearing with no vomiting and taking po well.  She will be treated for bv.  Malposition of iud noted and discussed with patient- she will recheck with Dr.Feguson this week. She is given follow up instructions and return precautions and voices understanding.   I personally performed the services described in this documentation, which was scribed in my presence. The recorded information has been reviewed and considered.     Hilario Quarry, MD 10/05/12 1210

## 2012-10-05 NOTE — ED Notes (Signed)
Pt c/o abd pain,  pain started in right lower quad moved to mid abd area, pain with urination that started yesterday, admits to nausea, denies any vomiting, diarrhea.

## 2012-10-07 LAB — URINE CULTURE

## 2012-11-20 ENCOUNTER — Encounter (INDEPENDENT_AMBULATORY_CARE_PROVIDER_SITE_OTHER): Payer: Self-pay

## 2012-11-20 ENCOUNTER — Other Ambulatory Visit (HOSPITAL_COMMUNITY)
Admission: RE | Admit: 2012-11-20 | Discharge: 2012-11-20 | Disposition: A | Discharge status: Home / Self Care | Payer: Medicaid Other | Source: Ambulatory Visit | Attending: Obstetrics and Gynecology | Admitting: Obstetrics and Gynecology

## 2012-11-20 ENCOUNTER — Ambulatory Visit (INDEPENDENT_AMBULATORY_CARE_PROVIDER_SITE_OTHER): Payer: Medicaid Other | Admitting: Adult Health

## 2012-11-20 ENCOUNTER — Encounter: Payer: Self-pay | Admitting: Adult Health

## 2012-11-20 VITALS — BP 102/70 | HR 78 | Ht 62.0 in | Wt 182.0 lb

## 2012-11-20 DIAGNOSIS — Z01419 Encounter for gynecological examination (general) (routine) without abnormal findings: Secondary | ICD-10-CM

## 2012-11-20 DIAGNOSIS — N926 Irregular menstruation, unspecified: Secondary | ICD-10-CM

## 2012-11-20 DIAGNOSIS — Z0000 Encounter for general adult medical examination without abnormal findings: Secondary | ICD-10-CM

## 2012-11-20 HISTORY — DX: Irregular menstruation, unspecified: N92.6

## 2012-11-20 NOTE — Patient Instructions (Signed)
Physical in 1 year If continues to have bleeding call

## 2012-11-20 NOTE — Progress Notes (Signed)
Patient ID: Tiffany [name], female   DOB: 11/04/1990, 22 y.o.   MRN: [id number] History of Present Illness: Daphney is a 22 year old black female single in for pap and physical, she is having irregular bleeding with IUD.Denies pain.   Current Medications, Allergies, Past Medical History, Past Surgical History, Family History and Social History were reviewed in [employer] record.     Review of Systems: Patient denies any headaches, blurred vision, shortness of breath, chest pain, abdominal pain, problems with bowel movements, urination, or intercourse. NO joint pain or mood swings, has harder time to reach orgasm,discussed ways to make it better.    Physical Exam:BP 102/70  Pulse 78  Ht 5\' 2"  (1.575 m)  Wt 182 lb (82.555 kg)  BMI 33.28 kg/m2 General:  Well developed, well nourished, no acute distress Skin:  Warm and dry, has tattoos Neck:  Midline trachea, normal thyroid Lungs; Clear to auscultation bilaterally Breast:  No dominant palpable mass, retraction, or nipple discharge, left greater than right Cardiovascular: Regular rate and rhythm Abdomen:  Soft, non tender, no hepatosplenomegaly Pelvic:  External genitalia is normal in appearance.  The vagina is normal in appearance. The cervix is bulbous with +IUD strings and pink mucous at os. Pap performed. And will get GC/CHL. Uterus is felt to be normal size, shape, and contour.  No  adnexal masses or tenderness noted. Extremities:  No swelling or varicosities noted Psych:  No mood changes, alert and cooperative Discussed options to stop bleeding will watch for now.  Impression: Yearly gyn exam Irregular bleeding has IUD    Plan: Physical in 1 year  GC/CHL Call if bleeding continues Use condoms

## 2012-11-21 LAB — GC/CHLAMYDIA PROBE AMP: GC Probe RNA: NEGATIVE

## 2013-02-25 ENCOUNTER — Emergency Department (HOSPITAL_COMMUNITY)
Admission: EM | Admit: 2013-02-25 | Discharge: 2013-02-25 | Disposition: A | Discharge status: Home / Self Care | Payer: Medicaid Other | Attending: Emergency Medicine | Admitting: Emergency Medicine

## 2013-02-25 ENCOUNTER — Encounter (HOSPITAL_COMMUNITY): Payer: Self-pay | Admitting: Emergency Medicine

## 2013-02-25 DIAGNOSIS — Z8742 Personal history of other diseases of the female genital tract: Secondary | ICD-10-CM | POA: Insufficient documentation

## 2013-02-25 DIAGNOSIS — R6883 Chills (without fever): Secondary | ICD-10-CM | POA: Insufficient documentation

## 2013-02-25 DIAGNOSIS — R109 Unspecified abdominal pain: Secondary | ICD-10-CM

## 2013-02-25 DIAGNOSIS — K5900 Constipation, unspecified: Secondary | ICD-10-CM | POA: Insufficient documentation

## 2013-02-25 DIAGNOSIS — Z8659 Personal history of other mental and behavioral disorders: Secondary | ICD-10-CM | POA: Insufficient documentation

## 2013-02-25 DIAGNOSIS — N898 Other specified noninflammatory disorders of vagina: Secondary | ICD-10-CM | POA: Insufficient documentation

## 2013-02-25 DIAGNOSIS — R1110 Vomiting, unspecified: Secondary | ICD-10-CM | POA: Insufficient documentation

## 2013-02-25 DIAGNOSIS — Z9889 Other specified postprocedural states: Secondary | ICD-10-CM | POA: Insufficient documentation

## 2013-02-25 DIAGNOSIS — Z8679 Personal history of other diseases of the circulatory system: Secondary | ICD-10-CM | POA: Insufficient documentation

## 2013-02-25 DIAGNOSIS — N390 Urinary tract infection, site not specified: Secondary | ICD-10-CM

## 2013-02-25 LAB — COMPREHENSIVE METABOLIC PANEL
ALBUMIN: 3.9 g/dL (ref 3.5–5.2)
ALT: 9 U/L (ref 0–35)
AST: 14 U/L (ref 0–37)
Alkaline Phosphatase: 64 U/L (ref 39–117)
BUN: 11 mg/dL (ref 6–23)
CALCIUM: 8.9 mg/dL (ref 8.4–10.5)
CO2: 25 mEq/L (ref 19–32)
Chloride: 107 mEq/L (ref 96–112)
Creatinine, Ser: 0.91 mg/dL (ref 0.50–1.10)
GFR calc non Af Amer: 90 mL/min — ABNORMAL LOW (ref >90)
Glucose, Bld: 86 mg/dL (ref 70–99)
Potassium: 4 mEq/L (ref 3.7–5.3)
SODIUM: 143 meq/L (ref 137–147)
TOTAL PROTEIN: 7.3 g/dL (ref 6.0–8.3)
Total Bilirubin: 0.4 mg/dL (ref 0.3–1.2)

## 2013-02-25 LAB — URINALYSIS, ROUTINE W REFLEX MICROSCOPIC
Bilirubin Urine: NEGATIVE
GLUCOSE, UA: NEGATIVE mg/dL
Hgb urine dipstick: NEGATIVE
Ketones, ur: NEGATIVE mg/dL
LEUKOCYTES UA: NEGATIVE
Nitrite: POSITIVE — AB
PROTEIN: NEGATIVE mg/dL
SPECIFIC GRAVITY, URINE: 1.025 (ref 1.005–1.030)
UROBILINOGEN UA: 0.2 mg/dL (ref 0.0–1.0)
pH: 6 (ref 5.0–8.0)

## 2013-02-25 LAB — URINE MICROSCOPIC-ADD ON

## 2013-02-25 LAB — CBC WITH DIFFERENTIAL/PLATELET
BASOS PCT: 0 % (ref 0–1)
Basophils Absolute: 0 10*3/uL (ref 0.0–0.1)
EOS ABS: 0.1 10*3/uL (ref 0.0–0.7)
EOS PCT: 4 % (ref 0–5)
HCT: 39.2 % (ref 36.0–46.0)
Hemoglobin: 13.6 g/dL (ref 12.0–15.0)
Lymphocytes Relative: 42 % (ref 12–46)
Lymphs Abs: 1.5 10*3/uL (ref 0.7–4.0)
MCH: 29.5 pg (ref 26.0–34.0)
MCHC: 34.7 g/dL (ref 30.0–36.0)
MCV: 85 fL (ref 78.0–100.0)
Monocytes Absolute: 0.5 10*3/uL (ref 0.1–1.0)
Monocytes Relative: 15 % — ABNORMAL HIGH (ref 3–12)
Neutro Abs: 1.4 10*3/uL — ABNORMAL LOW (ref 1.7–7.7)
Neutrophils Relative %: 40 % — ABNORMAL LOW (ref 43–77)
PLATELETS: 209 10*3/uL (ref 150–400)
RBC: 4.61 MIL/uL (ref 3.87–5.11)
RDW: 12.7 % (ref 11.5–15.5)
WBC: 3.5 10*3/uL — ABNORMAL LOW (ref 4.0–10.5)

## 2013-02-25 LAB — LIPASE, BLOOD: Lipase: 42 U/L (ref 11–59)

## 2013-02-25 MED ORDER — ONDANSETRON HCL 4 MG/2ML IJ SOLN
4.0000 mg | Freq: Once | INTRAMUSCULAR | Status: AC
  Administered 2013-02-25: 4 mg via INTRAVENOUS
  Filled 2013-02-25: qty 2

## 2013-02-25 MED ORDER — HYDROCODONE-ACETAMINOPHEN 5-325 MG PO TABS: 2.0000 | ORAL_TABLET | ORAL | Status: DC | PRN

## 2013-02-25 MED ORDER — KETOROLAC TROMETHAMINE 30 MG/ML IJ SOLN
30.0000 mg | Freq: Once | INTRAMUSCULAR | Status: AC
  Administered 2013-02-25: 30 mg via INTRAVENOUS
  Filled 2013-02-25: qty 1

## 2013-02-25 MED ORDER — SODIUM CHLORIDE 0.9 % IV BOLUS (SEPSIS)
1000.0000 mL | Freq: Once | INTRAVENOUS | Status: AC
  Administered 2013-02-25: 1000 mL via INTRAVENOUS

## 2013-02-25 MED ORDER — CIPROFLOXACIN HCL 500 MG PO TABS: 500.0000 mg | ORAL_TABLET | Freq: Two times a day (BID) | ORAL | Status: DC

## 2013-02-25 NOTE — ED Notes (Signed)
abd pain radiating from bil flanks to lower abd with n/v.  Reports constipation.  Also reports "knot" around umbilicus getting larger x 1 yr since c-section.  Denies GU sx.

## 2013-02-25 NOTE — ED Provider Notes (Addendum)
CSN: [id number]     Arrival date & time 02/25/13  1147 History  This chart was scribed for Geoffery Lyons, MD, by Yevette Edwards, ED Scribe. This patient was seen in room APA09/APA09 and the patient's care was started at 1:03 PM.   First MD Initiated Contact with Patient 02/25/13 1302     Chief Complaint  Patient presents with  . Abdominal Pain    The history is provided by the patient. No language interpreter was used.   HPI Comments: Tiffany [name] is a 23 y.o. female, with a h/o BV and UTI,  who presents to the Emergency Department complaining of diffuse abdominal pain which has been occurring for several days. She has experienced vaginal bleeding, constipation, emesis, and chills. She denies fever, dysuria, difficulty urinating, or diarrhea. She states she has been exposed to sick contacts.The pt also reports she has experienced a gradually-increasing umbilical knot for several months; she reports the knot appears intermittently. The pt had a Caesarean section a year ago. She denies any other abdominal surgery. She is not currently breast-feeding.   Past Medical History  Diagnosis Date  . Anxiety   . Hemorrhoids   . Mental disorder     bipolar  . Depression   . BV (bacterial vaginosis) 08/01/2012  . Irregular bleeding 11/20/2012   Past Surgical History  Procedure Laterality Date  . No past surgeries    . Cesarean section  01/29/2012    Procedure: CESAREAN SECTION;  Surgeon: Tereso Newcomer, MD;  Location: [facility];  Service: Obstetrics;  Laterality: N/A;  primary cesarean section of baby girl at 53 APGAR 8/9   Family History  Problem Relation Age of Onset  . Other Neg Hx   . Diabetes Maternal Grandmother   . Cancer Maternal Grandfather    History  Substance Use Topics  . Smoking status: Never Smoker   . Smokeless tobacco: Never Used  . Alcohol Use: No   OB History   Grav Para Term Preterm Abortions TAB SAB Ect Mult Living   2 1 1  1  1   1      Review of Systems   Constitutional: Positive for chills. Negative for fever.  Gastrointestinal: Positive for vomiting, abdominal pain and constipation. Negative for diarrhea.  Genitourinary: Positive for vaginal bleeding. Negative for dysuria and difficulty urinating.  All other systems reviewed and are negative.   Allergies  Mushroom extract complex; Cherry; and Strawberry  Home Medications   Current Outpatient Rx  Name  Route  Sig  Dispense  Refill  . acetaminophen (TYLENOL) 325 MG tablet   Oral   Take 1,300 mg by mouth daily as needed for mild pain.         Marland Kitchen levonorgestrel (MIRENA) 20 MCG/24HR IUD   Intrauterine   1 each by Intrauterine route once.          Triage Vitals: BP 115/68  Pulse 66  Temp(Src) 98.3 F (36.8 C) (Oral)  Resp 18  Ht 5\' 2"  (1.575 m)  Wt 175 lb (79.379 kg)  BMI 32.00 kg/m2  SpO2 98%  LMP 02/11/2013  Physical Exam  Nursing note and vitals reviewed. Constitutional: She is oriented to person, place, and time. She appears well-developed and well-nourished. No distress.  HENT:  Head: Normocephalic and atraumatic.  Eyes: EOM are normal.  Neck: Neck supple. No tracheal deviation present.  Cardiovascular: Normal rate.   Pulmonary/Chest: Effort normal. No respiratory distress.  Abdominal: There is tenderness. There is no rebound and  no guarding.  Tenderness to palpation in all four quadrants with no rebound or guarding.  I am unable to palpate a ventral hernia or abdominal wall defect.   Musculoskeletal: Normal range of motion.  Neurological: She is alert and oriented to person, place, and time.  Skin: Skin is warm and dry.  Psychiatric: She has a normal mood and affect. Her behavior is normal.    ED Course  Procedures (including critical care time)  DIAGNOSTIC STUDIES: Oxygen Saturation is 98% on room air, normal by my interpretation.    COORDINATION OF CARE:  1:11 PM- Discussed treatment plan with patient, which includes UA, and the patient agreed to the  plan.   3:13 PM- Rechecked pt. Informed pt that she has a UTI and would be prescribed an antibiotic. Discussed the possibility of a hernia with the pt. Informed the pt that if her symptoms related to the knot increase, she should consider surgery.   Labs Review Labs Reviewed  URINALYSIS, ROUTINE W REFLEX MICROSCOPIC - Abnormal; Notable for the following:    APPearance CLOUDY (*)    Nitrite POSITIVE (*)    All other components within normal limits  URINE MICROSCOPIC-ADD ON - Abnormal; Notable for the following:    Squamous Epithelial / LPF FEW (*)    Bacteria, UA MANY (*)    All other components within normal limits  CBC WITH DIFFERENTIAL - Abnormal; Notable for the following:    WBC 3.5 (*)    Neutrophils Relative % 40 (*)    Neutro Abs 1.4 (*)    Monocytes Relative 15 (*)    All other components within normal limits  COMPREHENSIVE METABOLIC PANEL - Abnormal; Notable for the following:    GFR calc non Af Amer 90 (*)    All other components within normal limits  URINE CULTURE  LIPASE, BLOOD   Imaging Review No results found.    MDM  No diagnosis found. Patient is a 23 year old female who presents with complaints of generalized abdominal discomfort and bilateral flanks for the past several days. Workup reveals no elevation of white count, normal liver and pancreatic function, and urine that is reflective of a urinary tract infection. We'll treat with Cipro and hydrocodone. She understands to return if her symptoms worsen or change. I doubt appendicitis, cholecystitis, or other acute surgical process. Point-of-care pregnancy test was negative, thus excluding the possibility of an ectopic pregnancy.  I personally performed the services described in this documentation, which was scribed in my presence. The recorded information has been reviewed and is accurate.       Geoffery Lyonsouglas Malon Siddall, MD 02/25/13 1519  Geoffery Lyonsouglas Alois Mincer, MD 02/25/13 [id number]

## 2013-02-25 NOTE — ED Notes (Signed)
POC Pregnancy test was completed by Alvino ChapelEllen, RN. Test result was negative.

## 2013-02-25 NOTE — ED Notes (Signed)
Dr. Delo at bedside. 

## 2013-02-25 NOTE — Discharge Instructions (Signed)
Cipro as prescribed.  Hydrocodone as prescribed as needed for pain.  Return to the emergency department if you develop severe pain, high fever, bloody stool, or if your symptoms do not improve in the next 24-48 hours.   Abdominal Pain, Adult Many things can cause abdominal pain. Usually, abdominal pain is not caused by a disease and will improve without treatment. It can often be observed and treated at home. Your health care provider will do a physical exam and possibly order blood tests and X-rays to help determine the seriousness of your pain. However, in many cases, more time must pass before a clear cause of the pain can be found. Before that point, your health care provider may not know if you need more testing or further treatment. HOME CARE INSTRUCTIONS  Monitor your abdominal pain for any changes. The following actions may help to alleviate any discomfort you are experiencing:  Only take over-the-counter or prescription medicines as directed by your health care provider.  Do not take laxatives unless directed to do so by your health care provider.  Try a clear liquid diet (broth, tea, or water) as directed by your health care provider. Slowly move to a bland diet as tolerated. SEEK MEDICAL CARE IF:  You have unexplained abdominal pain.  You have abdominal pain associated with nausea or diarrhea.  You have pain when you urinate or have a bowel movement.  You experience abdominal pain that wakes you in the night.  You have abdominal pain that is worsened or improved by eating food.  You have abdominal pain that is worsened with eating fatty foods. SEEK IMMEDIATE MEDICAL CARE IF:   Your pain does not go away within 2 hours.  You have a fever.  You keep throwing up (vomiting).  Your pain is felt only in portions of the abdomen, such as the right side or the left lower portion of the abdomen.  You pass bloody or black tarry stools. MAKE SURE YOU:  Understand these  instructions.   Will watch your condition.   Will get help right away if you are not doing well or get worse.  Document Released: 11/09/2004 Document Revised: 11/20/2012 Document Reviewed: 10/09/2012 [facility] LP[facility] Patient Information 2014 [place], [place].

## 2013-02-28 LAB — URINE CULTURE

## 2013-03-01 ENCOUNTER — Telehealth (HOSPITAL_COMMUNITY): Payer: Self-pay | Admitting: Emergency Medicine

## 2013-03-01 NOTE — ED Notes (Signed)
Post ED Visit - Positive Culture Follow-up  Culture report reviewed by antimicrobial stewardship pharmacist: []  Tiffany [name], Pharm.D., BCPS []  Tiffany [name], Pharm.D., BCPS []  Tiffany [name], [address]Ph[address] BCPS [x]  [place] [name], [address]Pharm[address]D., BCPS, AAHIVP []  Tiffany [name], Pharm.D., BCPS, AAHIVP  Positive urine culture Treated with Cipro, organism sensitive to the same and no further patient follow-up is required at this time.  [place], Tiffany [name] 03/01/2013, 11:08 AM

## 2013-12-15 ENCOUNTER — Encounter (HOSPITAL_COMMUNITY): Payer: Self-pay | Admitting: Emergency Medicine

## 2014-02-12 IMAGING — US US ABDOMEN COMPLETE
1 series · 14 of 25 positions shown · non-contrast
Comparison: None

CLINICAL DATA: Abdominal pain

ULTRASOUND ABDOMEN:
TECHNIQUE: Sonography of upper abdominal structures was performed.

[Series 1: us abdomen complete · 0.22mm/px · 14 of 94 slices shown]
[im 1/94]
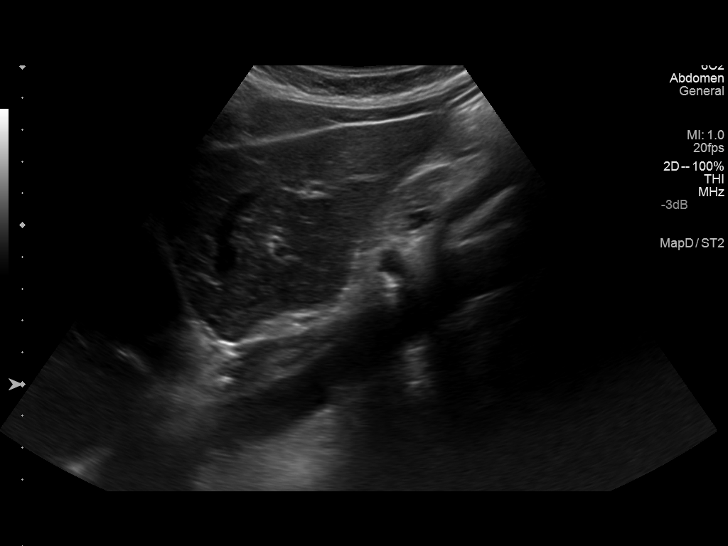
[im 8/94]
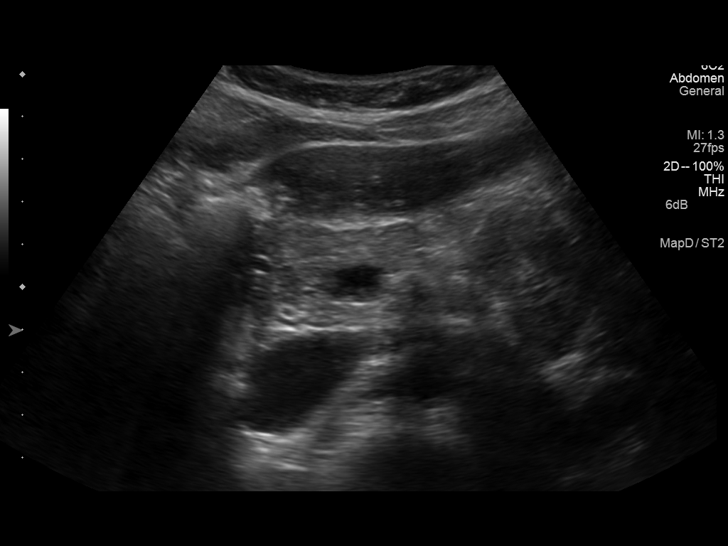
[im 16/94]
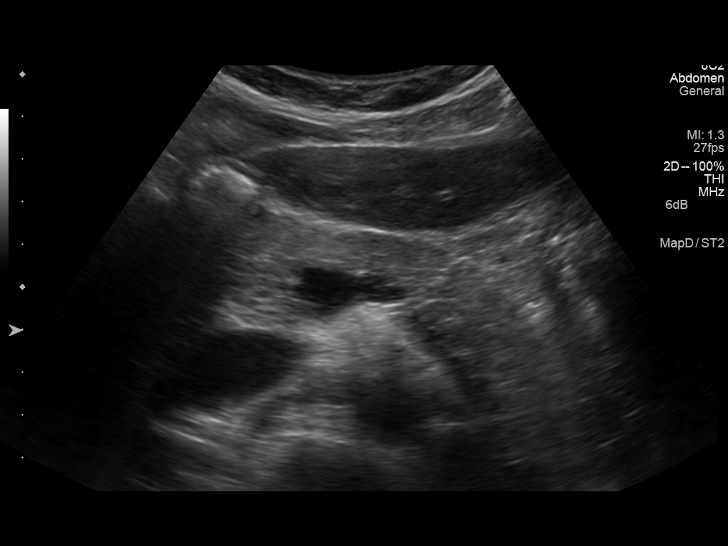
[im 24/94]
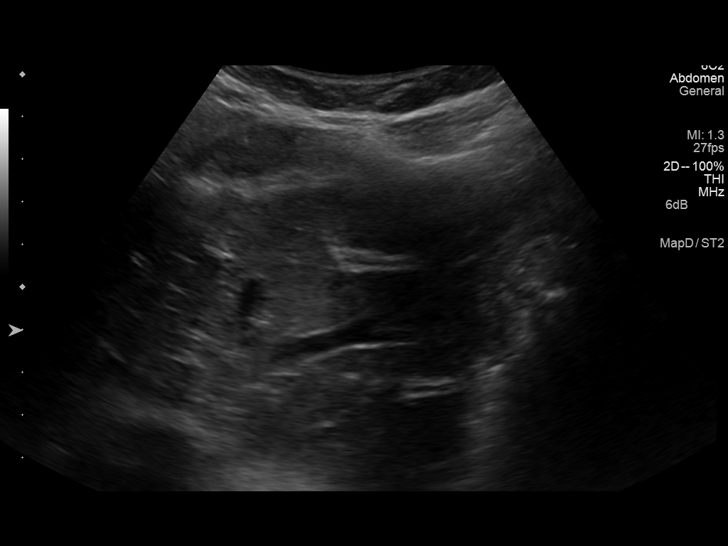
[im 32/94]
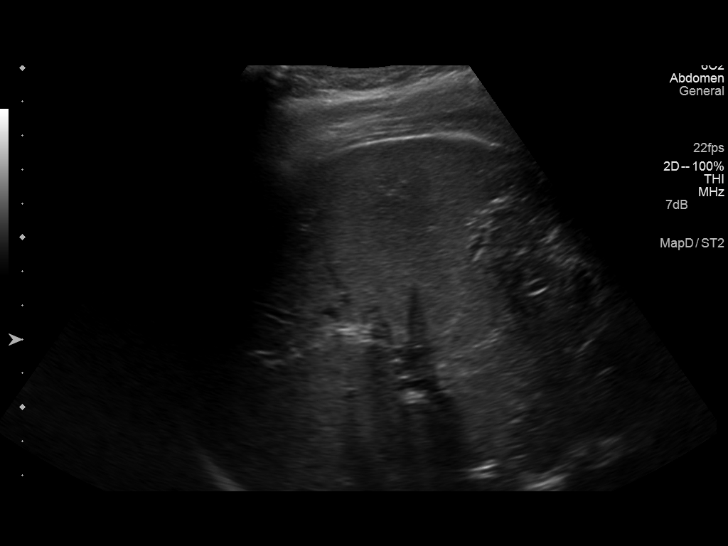
[im 35/94]
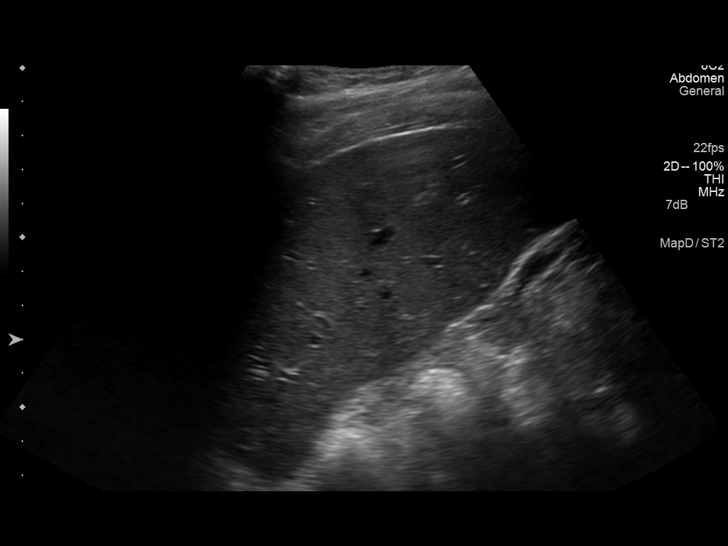
[im 43/94]
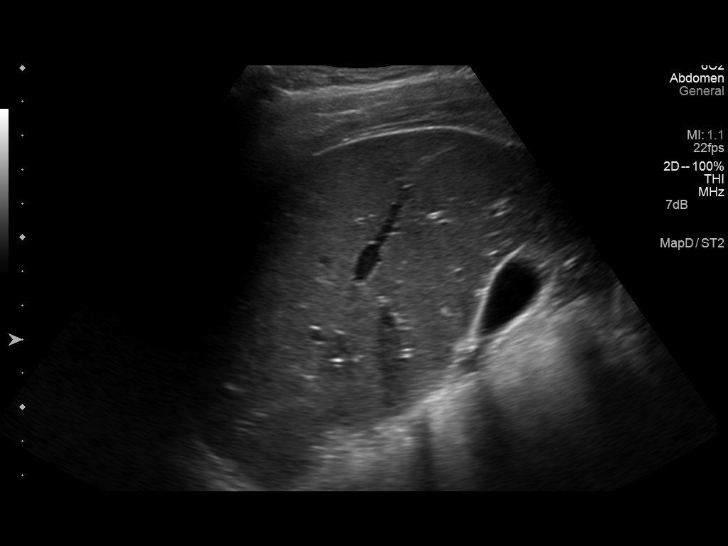
[im 51/94]
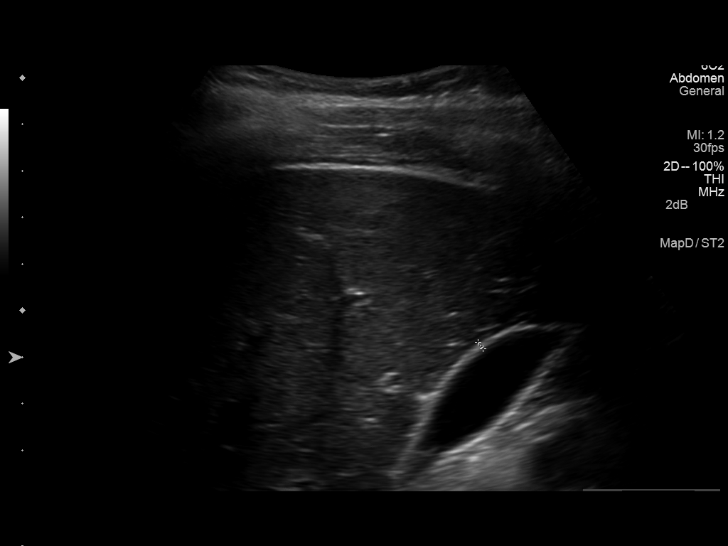
[im 59/94]
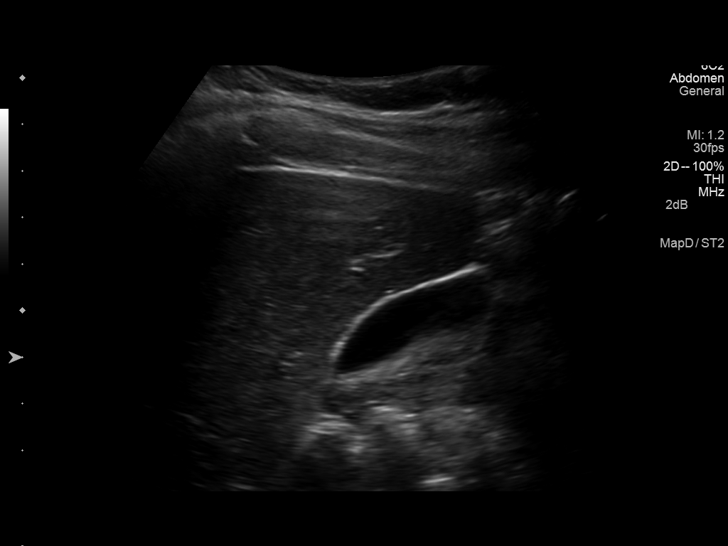
[im 63/94]
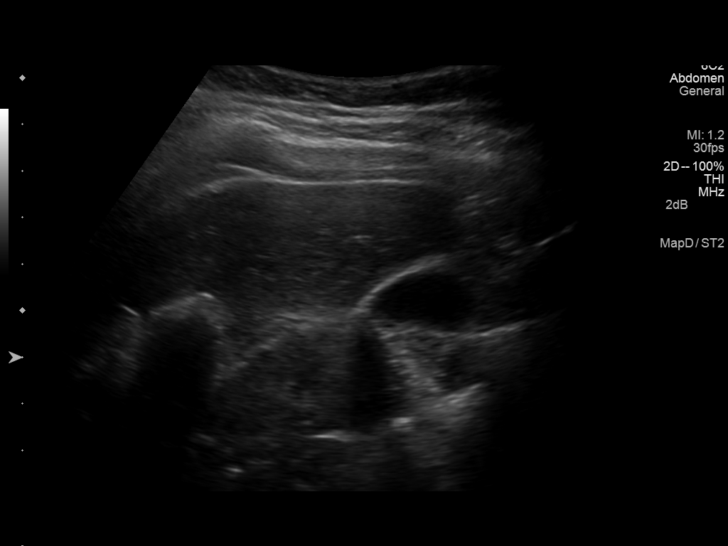
[im 70/94]
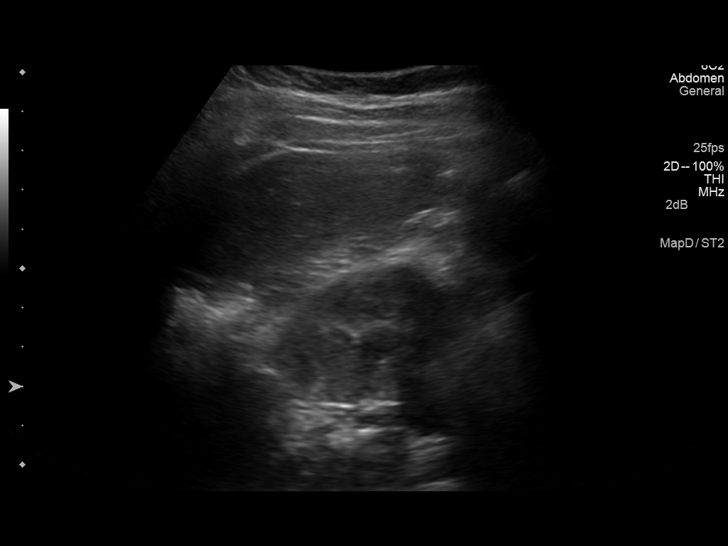
[im 78/94]
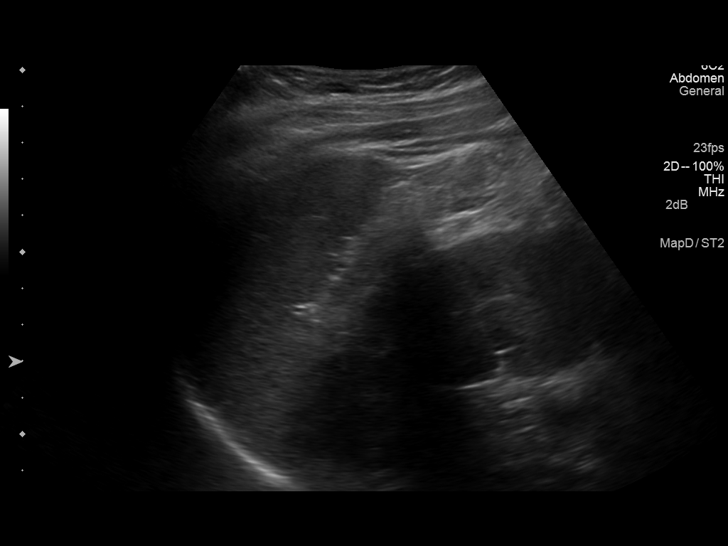
[im 86/94]
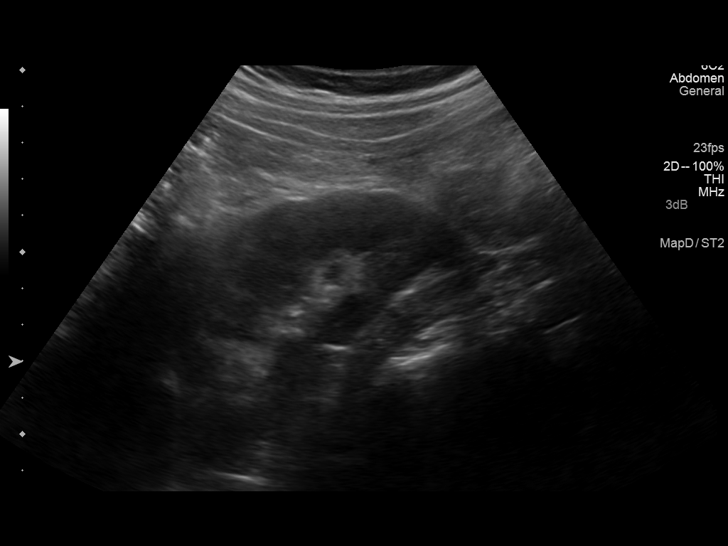
[im 94/94]
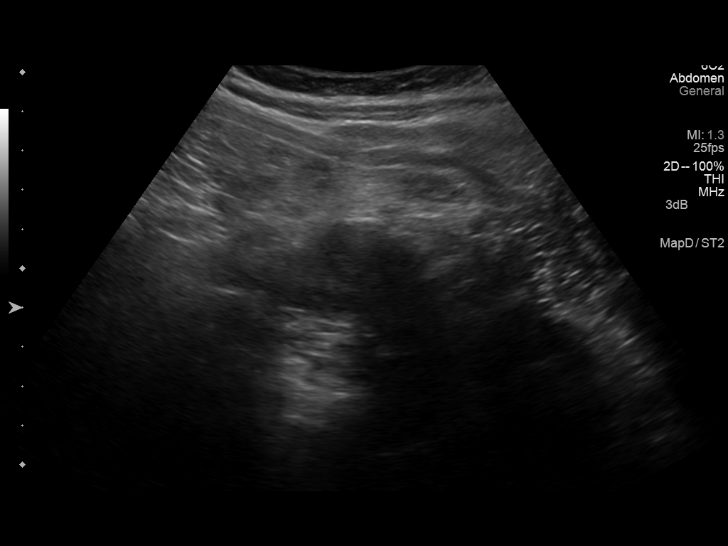

[14 of 25 positions shown; findings below may reference images not displayed]

Gallbladder:  Contracted limiting assessment (patient ate 3 hours
prior to exam).  No obvious gallstones, wall thickening, or
sonographic Murphy's sign.  No pericholecystic fluid.

Common bile duct:  4 mm diameter, normal

Liver:  Normal appearance

IVC:  Normal appearance

Pancreas:  Normal appearance

Spleen:  Normal appearance, 6.2 cm length

Right kidney:  10.6 cm length. Normal morphology without mass or
hydronephrosis.

Left kidney:  10.3 cm length. Normal morphology without mass or
hydronephrosis.

Aorta:  Normal caliber

Other:  No free fluid
IMPRESSION: Contracted gallbladder, question related to food intake 3 hours
before exam.
Otherwise negative upper abdominal ultrasound.

## 2014-02-19 ENCOUNTER — Emergency Department (HOSPITAL_COMMUNITY): Payer: Medicaid Other

## 2014-02-19 ENCOUNTER — Encounter (HOSPITAL_COMMUNITY): Payer: Self-pay | Admitting: Emergency Medicine

## 2014-02-19 ENCOUNTER — Emergency Department (HOSPITAL_COMMUNITY)
Admission: EM | Admit: 2014-02-19 | Discharge: 2014-02-19 | Disposition: A | Discharge status: Home / Self Care | Payer: Medicaid Other | Attending: Emergency Medicine | Admitting: Emergency Medicine

## 2014-02-19 DIAGNOSIS — Z8719 Personal history of other diseases of the digestive system: Secondary | ICD-10-CM | POA: Insufficient documentation

## 2014-02-19 DIAGNOSIS — Z8659 Personal history of other mental and behavioral disorders: Secondary | ICD-10-CM | POA: Insufficient documentation

## 2014-02-19 DIAGNOSIS — R52 Pain, unspecified: Secondary | ICD-10-CM

## 2014-02-19 DIAGNOSIS — Z872 Personal history of diseases of the skin and subcutaneous tissue: Secondary | ICD-10-CM | POA: Insufficient documentation

## 2014-02-19 DIAGNOSIS — M25512 Pain in left shoulder: Secondary | ICD-10-CM | POA: Insufficient documentation

## 2014-02-19 DIAGNOSIS — Z8619 Personal history of other infectious and parasitic diseases: Secondary | ICD-10-CM | POA: Insufficient documentation

## 2014-02-19 MED ORDER — SODIUM CHLORIDE 0.9 % IV BOLUS (SEPSIS)
1000.0000 mL | INTRAVENOUS | Status: AC
  Administered 2014-02-19: 1000 mL via INTRAVENOUS

## 2014-02-19 MED ORDER — HYDROMORPHONE HCL 1 MG/ML IJ SOLN
0.5000 mg | Freq: Once | INTRAMUSCULAR | Status: AC
  Administered 2014-02-19: 0.5 mg via INTRAVENOUS
  Filled 2014-02-19: qty 1

## 2014-02-19 MED ORDER — ACETAMINOPHEN 325 MG PO TABS: 650.0000 mg | ORAL_TABLET | Freq: Once | ORAL | Status: DC

## 2014-02-19 NOTE — ED Notes (Signed)
Pt reports she was in living room and coughed and shoulder popped out. Has never occurred in past. Pulses strong and has some ROM.

## 2014-02-19 NOTE — ED Notes (Signed)
Per radiology tech patient became dizzy and near syncopal upon standing after shoulder xray due to pain. They notified this RN. Pt acuity upgraded and pt placed in ready room.

## 2014-02-19 NOTE — ED Provider Notes (Signed)
CSN: [id number]     Arrival date & time 02/19/14  1251 History   First MD Initiated Contact with Patient 02/19/14 1349     Chief Complaint  Patient presents with  . Shoulder Pain     (Consider location/radiation/quality/duration/timing/severity/associated sxs/prior Treatment) Patient is a 24 y.o. female presenting with shoulder pain. The history is provided by the patient.  Shoulder Pain Location:  Shoulder Time since incident:  3 hours Injury: no   Shoulder location:  L shoulder Pain details:    Quality:  Aching   Radiates to:  Does not radiate   Severity:  Moderate   Onset quality:  Sudden   Duration:  3 hours   Timing:  Constant   Progression:  Unchanged Chronicity:  New Foreign body present:  No foreign bodies Prior injury to area:  No Relieved by:  Nothing Worsened by:  Movement Ineffective treatments:  None tried Associated symptoms: no back pain, no fatigue, no fever and no neck pain     Past Medical History  Diagnosis Date  . Anxiety   . Hemorrhoids   . Mental disorder     bipolar  . Depression   . BV (bacterial vaginosis) 08/01/2012  . Irregular bleeding 11/20/2012   Past Surgical History  Procedure Laterality Date  . No past surgeries    . Cesarean section  01/29/2012    Procedure: CESAREAN SECTION;  Surgeon: Tereso Newcomer, MD;  Location: [facility];  Service: Obstetrics;  Laterality: N/A;  primary cesarean section of baby girl at 102 APGAR 8/9   Family History  Problem Relation Age of Onset  . Other Neg Hx   . Diabetes Maternal Grandmother   . Cancer Maternal Grandfather    History  Substance Use Topics  . Smoking status: Never Smoker   . Smokeless tobacco: Never Used  . Alcohol Use: No   OB History    Gravida Para Term Preterm AB TAB SAB Ectopic Multiple Living   Review of Systems  Constitutional: Negative for fever and fatigue.  HENT: Negative for congestion and drooling.   Eyes: Negative for pain.  Respiratory:  Negative for cough and shortness of breath.   Cardiovascular: Negative for chest pain.  Gastrointestinal: Negative for nausea, vomiting, abdominal pain and diarrhea.  Genitourinary: Negative for dysuria and hematuria.  Musculoskeletal: Negative for back pain, gait problem and neck pain.  Skin: Negative for color change.  Neurological: Negative for dizziness and headaches.  Hematological: Negative for adenopathy.  Psychiatric/Behavioral: Negative for behavioral problems.  All other systems reviewed and are negative.     Allergies  Mushroom extract complex; Cherry; and Strawberry  Home Medications   Prior to Admission medications   Medication Sig Start Date End Date Taking? Authorizing Provider  acetaminophen (TYLENOL) 325 MG tablet Take 1,300 mg by mouth daily as needed for mild pain.    Historical Provider, MD  ciprofloxacin (CIPRO) 500 MG tablet Take 1 tablet (500 mg total) by mouth every 12 (twelve) hours. 02/25/13   Geoffery Lyons, MD  HYDROcodone-acetaminophen (NORCO) 5-325 MG per tablet Take 2 tablets by mouth every 4 (four) hours as needed. 02/25/13   Geoffery Lyons, MD  levonorgestrel (MIRENA) 20 MCG/24HR IUD 1 each by Intrauterine route once.    Historical Provider, MD   BP 132/92 mmHg  Pulse 60  Temp(Src) 98.4 F (36.9 C) (Oral)  Resp 20  Ht  (1.575 m)  Wt 155  lb (70.308 kg)  BMI 28.34 kg/m2  SpO2 100%  LMP 02/17/2014 Physical Exam  Constitutional: She is oriented to person, place, and time. She appears well-developed and well-nourished.  HENT:  Head: Normocephalic.  Mouth/Throat: Oropharynx is clear and moist. No oropharyngeal exudate.  Eyes: Conjunctivae and EOM are normal. Pupils are equal, round, and reactive to light.  Neck: Normal range of motion. Neck supple.  Cardiovascular: Normal rate, regular rhythm, normal heart sounds and intact distal pulses.  Exam reveals no gallop and no friction rub.   No murmur heard. Pulmonary/Chest: Effort normal and breath  sounds normal. No respiratory distress. She has no wheezes.  Abdominal: Soft. Bowel sounds are normal. There is no tenderness. There is no rebound and no guarding.  Musculoskeletal: Normal range of motion. She exhibits tenderness. She exhibits no edema.  Limited range of motion of the left shoulder due to pain.  2+ proximal and distal pulses in the left upper extremity.  Normal sensation in the left upper extremity.  Normal range of motion of the left elbow and normal motor skills of the left hand.  Neurological: She is alert and oriented to person, place, and time.  Skin: Skin is warm and dry.  Psychiatric: She has a normal mood and affect. Her behavior is normal.  Nursing note and vitals reviewed.   ED Course  Procedures (including critical care time) Labs Review Labs Reviewed - No data to display  Imaging Review Dg Shoulder Left  02/19/2014   CLINICAL DATA:  Left shoulder pain  EXAM: LEFT SHOULDER - 2+ VIEW  COMPARISON:  None.  FINDINGS: There is no evidence of fracture or dislocation. There is no evidence of arthropathy or other focal bone abnormality. Soft tissues are unremarkable.  IMPRESSION: Negative.   Electronically Signed   By: Elige KoHetal  Patel   On: 02/19/2014 13:50     EKG Interpretation None      MDM   Final diagnoses:  Left shoulder pain    2:00 PM 24 y.o. female who presents with left shoulder pain which began at approximately 10:30 AM when she coughed. She states she felt a pop in her left shoulder when she coughed and she has had pain in the shoulder since that time. She states that she felt near syncopal and radiology while they were manipulating the shoulder to get x-rays. Her vital signs are unremarkable here she appears well on exam. Plain film imaging is noncontributory. She has normal sensation and motor skills in the left arm. Will get pain control and reevaluate.  LMP now. Pt currently on menstrual cycle.   3:16 PM: I interpreted/reviewed the labs and/or  imaging which were non-contributory.  Likely MSK injury.  I have discussed the diagnosis/risks/treatment options with the patient and believe the pt to be eligible for discharge home to follow-up with her pcp. We also discussed returning to the ED immediately if new or worsening sx occur. We discussed the sx which are most concerning (e.g., worsening pain) that necessitate immediate return. Medications administered to the patient during their visit and any new prescriptions provided to the patient are listed below.  Medications given during this visit Medications  acetaminophen (TYLENOL) tablet 650 mg (not administered)  sodium chloride 0.9 % bolus 1,000 mL (1,000 mLs Intravenous New Bag/Given 02/19/14 1414)  HYDROmorphone (DILAUDID) injection 0.5 mg (0.5 mg Intravenous Given 02/19/14 1421)    New Prescriptions   No medications on file     Purvis SheffieldForrest Moises Terpstra, MD 02/19/14 1534

## 2014-02-19 NOTE — ED Notes (Signed)
No deformity noted.

## 2014-02-19 NOTE — ED Notes (Signed)
Pt received from xray on stretcher holding shoulder-- states 10/10 pain, is alert/oriented x4,

## 2014-04-22 ENCOUNTER — Other Ambulatory Visit: Payer: Medicaid Other | Admitting: Obstetrics and Gynecology

## 2014-05-25 ENCOUNTER — Encounter (HOSPITAL_COMMUNITY): Payer: Self-pay | Admitting: Emergency Medicine

## 2014-05-25 ENCOUNTER — Emergency Department (HOSPITAL_COMMUNITY)
Admission: EM | Admit: 2014-05-25 | Discharge: 2014-05-26 | Disposition: A | Discharge status: Home / Self Care | Payer: Self-pay | Attending: Emergency Medicine | Admitting: Emergency Medicine

## 2014-05-25 DIAGNOSIS — Z793 Long term (current) use of hormonal contraceptives: Secondary | ICD-10-CM | POA: Insufficient documentation

## 2014-05-25 DIAGNOSIS — Z8719 Personal history of other diseases of the digestive system: Secondary | ICD-10-CM | POA: Insufficient documentation

## 2014-05-25 DIAGNOSIS — F329 Major depressive disorder, single episode, unspecified: Secondary | ICD-10-CM | POA: Insufficient documentation

## 2014-05-25 DIAGNOSIS — Z720 Tobacco use: Secondary | ICD-10-CM | POA: Insufficient documentation

## 2014-05-25 DIAGNOSIS — Z8742 Personal history of other diseases of the female genital tract: Secondary | ICD-10-CM | POA: Insufficient documentation

## 2014-05-25 DIAGNOSIS — F419 Anxiety disorder, unspecified: Secondary | ICD-10-CM | POA: Insufficient documentation

## 2014-05-25 DIAGNOSIS — R079 Chest pain, unspecified: Secondary | ICD-10-CM | POA: Insufficient documentation

## 2014-05-25 NOTE — ED Provider Notes (Signed)
CSN: [id number]     Arrival date & time 05/25/14  2240 History   This chart was scribed for Paula Libra, MD by Murriel Hopper, ED Scribe. This patient was seen in room APA02/APA02 and the patient's care was started at 11:41 PM.    Chief Complaint  Patient presents with  . Chest Pain      The history is provided by the patient. No language interpreter was used.     HPI Comments: Tiffany [name] is a 24 y.o. female who presents to the Emergency Department complaining of intermittent, sharp, chest pain from the left lateral submammary region to the upper left parasternal region that has been present for about two months. Pt states she saw a physician about the issue on May 20, 2014 in which the provider thought it may be related to heartburn; she was treated with Tums without relief. Pt states that her pain comes on in episodes in which a dull pain or tightness that may last for 30 minutes at a time, but may also have sharp pains that only last for seconds. Symptoms are moderate. There is associated shortness of breath and pain is worse with deep breathing.   Past Medical History  Diagnosis Date  . Anxiety   . Hemorrhoids   . Mental disorder     bipolar  . Depression   . BV (bacterial vaginosis) 08/01/2012  . Irregular bleeding 11/20/2012   Past Surgical History  Procedure Laterality Date  . No past surgeries    . Cesarean section  01/29/2012    Procedure: CESAREAN SECTION;  Surgeon: Tereso Newcomer, MD;  Location: [facility];  Service: Obstetrics;  Laterality: N/A;  primary cesarean section of baby girl at 34 APGAR 8/9   Family History  Problem Relation Age of Onset  . Other Neg Hx   . Diabetes Maternal Grandmother   . Cancer Maternal Grandfather    History  Substance Use Topics  . Smoking status: Current Some Day Smoker  . Smokeless tobacco: Never Used  . Alcohol Use: No   OB History    Gravida Para Term Preterm AB TAB SAB Ectopic Multiple Living   Review of Systems  All other systems reviewed and are negative.   Allergies  Mushroom extract complex; Cherry; and Strawberry  Home Medications   Prior to Admission medications   Medication Sig Start Date End Date Taking? Authorizing Provider  acetaminophen (TYLENOL) 325 MG tablet Take 650 mg by mouth daily as needed for mild pain.     Historical Provider, MD  ciprofloxacin (CIPRO) 500 MG tablet Take 1 tablet (500 mg total) by mouth every 12 (twelve) hours. Patient not taking: Reported on 02/19/2014 02/25/13   Geoffery Lyons, MD  citalopram (CELEXA) 20 MG tablet Take 20 mg by mouth daily.    Historical Provider, MD  HYDROcodone-acetaminophen (NORCO) 5-325 MG per tablet Take 2 tablets by mouth every 4 (four) hours as needed. Patient taking differently: Take 2 tablets by mouth every 4 (four) hours as needed (pain).  02/25/13   Geoffery Lyons, MD  levonorgestrel (MIRENA) 20 MCG/24HR IUD 1 each by Intrauterine route once.    Historical Provider, MD   BP 122/87 mmHg  Pulse 81  Temp(Src) 98.6 F (37 C) (Oral)  Resp 20  Ht 5' 2.5" (1.588 m)  Wt 160 lb (72.576 kg)  BMI 28.78 kg/m2  SpO2 100%  LMP 05/23/2014  Physical Exam General: Well-developed, well-nourished female in no acute distress; appearance consistent with age of record HENT: normocephalic; atraumatic Eyes: pupils equal, round and reactive to light; extraocular muscles intact Neck: supple Heart: regular rate and rhythm; no murmurs, rubs or gallops Lungs: clear to auscultation bilaterally Abdomen: soft; nondistended; no masses or hepatosplenomegaly; bowel sounds present; RUQ tenderness Extremities: No deformity; full range of motion; pulses normal Neurologic: Awake, alert and oriented; motor function intact in all extremities and symmetric; no facial droop Skin: Warm and dry Psychiatric: Normal mood and affect Chest: Nontender  ED Course  Procedures (including critical care time)  DIAGNOSTIC STUDIES: Oxygen Saturation is  100% on room air, normal by my interpretation.    COORDINATION OF CARE: 11:50 PM Discussed treatment plan with pt at bedside and pt agreed to plan.    EKG Interpretation   Date/Time:  Monday May 25 2014 22:56:44 EDT Ventricular Rate:  70 PR Interval:  150 QRS Duration: 75 QT Interval:  389 QTC Calculation: 420 R Axis:   53 Text Interpretation:  Sinus rhythm Low voltage, precordial leads Otherwise  normal ECG No old tracing to compare Confirmed by [facility] HospitalMOLPUS  MD, Jonny RuizJOHN ([id number])  on 05/25/2014 11:00:31 PM      MDM   Nursing notes and vitals signs, including pulse oximetry, reviewed.  Summary of this visit's results, reviewed by myself:  Labs:  Results for orders placed or performed during the hospital encounter of 05/25/14 (from the past 24 hour(s))  CBC with Differential/Platelet     Status: None   Collection Time: 05/26/14 12:07 AM  Result Value Ref Range   WBC 4.1 4.0 - 10.5 K/uL   RBC 4.64 3.87 - 5.11 MIL/uL   Hemoglobin 13.5 12.0 - 15.0 g/dL   HCT 78.240.7 95.636.0 - 21.346.0 %   MCV 87.7 78.0 - 100.0 fL   MCH 29.1 26.0 - 34.0 pg   MCHC 33.2 30.0 - 36.0 g/dL   RDW 08.612.7 57.811.5 - 46.915.5 %   Platelets 227 150 - 400 K/uL   Neutrophils Relative % 43 43 - 77 %   Neutro Abs 1.8 1.7 - 7.7 K/uL   Lymphocytes Relative 43 12 - 46 %   Lymphs Abs 1.8 0.7 - 4.0 K/uL   Monocytes Relative 12 3 - 12 %   Monocytes Absolute 0.5 0.1 - 1.0 K/uL   Eosinophils Relative 3 0 - 5 %   Eosinophils Absolute 0.1 0.0 - 0.7 K/uL   Basophils Relative 1 0 - 1 %   Basophils Absolute 0.0 0.0 - 0.1 K/uL  Comprehensive metabolic panel     Status: Abnormal   Collection Time: 05/26/14 12:07 AM  Result Value Ref Range   Sodium 143 135 - 145 mmol/L   Potassium 4.3 3.5 - 5.1 mmol/L   Chloride 110 96 - 112 mmol/L   CO2 26 19 - 32 mmol/L   Glucose, Bld 92 70 - 99 mg/dL   BUN 13 6 - 23 mg/dL   Creatinine, Ser 6.291.00 0.50 - 1.10 mg/dL   Calcium 9.1 8.4 - 52.810.5 mg/dL   Total Protein 6.8 6.0 - 8.3 g/dL   Albumin 4.0 3.5 -  5.2 g/dL   AST 19 0 - 37 U/L   ALT 14 0 - 35 U/L   Alkaline Phosphatase 48 39 - 117 U/L   Total Bilirubin 0.4 0.3 - 1.2 mg/dL   GFR calc non Af Amer 79 (L) >90 mL/min   GFR calc Af Amer >90 >90 mL/min  Anion gap 7 5 - 15  Troponin I     Status: None   Collection Time: 05/26/14 12:07 AM  Result Value Ref Range   Troponin I <0.03 <0.031 ng/mL    Imaging Studies: Dg Chest 2 View  05/26/2014   CLINICAL DATA:  Intermittent left-sided chest pain and pressure for 2 months.  EXAM: CHEST  2 VIEW  COMPARISON:  06/15/2012  FINDINGS: The cardiomediastinal contours are normal. The lungs are clear. Pulmonary vasculature is normal. No consolidation, pleural effusion, or pneumothorax. No acute osseous abnormalities are seen.  IMPRESSION: No acute pulmonary process.   Electronically Signed   By: Rubye Oaks M.D.   On: 05/26/2014 01:36   1:46 AM Patient asymptomatic. Chest pain in a 24 year old female is very unlikely to be related to coronary artery disease. She could have mitral valve prolapse which can cause intermittent pain in the left upper chest. We will refer to cardiology for outpatient evaluation.     Paula Libra, MD 05/26/14 [phone]

## 2014-05-25 NOTE — ED Notes (Signed)
Patient complaining of sharp pain to left chest that has been bothering her for almost two months. Also complains of shortness of breath, dizziness, hot flashes, and near syncope. States she has been evaluated by her doctor and diagnosed with heartburn and anxiety.

## 2014-05-26 ENCOUNTER — Emergency Department (HOSPITAL_COMMUNITY): Payer: Medicaid Other

## 2014-05-26 LAB — COMPREHENSIVE METABOLIC PANEL
ALBUMIN: 4 g/dL (ref 3.5–5.2)
ALT: 14 U/L (ref 0–35)
ANION GAP: 7 (ref 5–15)
AST: 19 U/L (ref 0–37)
Alkaline Phosphatase: 48 U/L (ref 39–117)
BUN: 13 mg/dL (ref 6–23)
CO2: 26 mmol/L (ref 19–32)
CREATININE: 1 mg/dL (ref 0.50–1.10)
Calcium: 9.1 mg/dL (ref 8.4–10.5)
Chloride: 110 mmol/L (ref 96–112)
GFR calc Af Amer: >90 mL/min (ref >90)
GFR calc non Af Amer: 79 mL/min — ABNORMAL LOW (ref >90)
Glucose, Bld: 92 mg/dL (ref 70–99)
Potassium: 4.3 mmol/L (ref 3.5–5.1)
Sodium: 143 mmol/L (ref 135–145)
TOTAL PROTEIN: 6.8 g/dL (ref 6.0–8.3)
Total Bilirubin: 0.4 mg/dL (ref 0.3–1.2)

## 2014-05-26 LAB — CBC WITH DIFFERENTIAL/PLATELET
Basophils Absolute: 0 10*3/uL (ref 0.0–0.1)
Basophils Relative: 1 % (ref 0–1)
EOS ABS: 0.1 10*3/uL (ref 0.0–0.7)
Eosinophils Relative: 3 % (ref 0–5)
HEMATOCRIT: 40.7 % (ref 36.0–46.0)
Hemoglobin: 13.5 g/dL (ref 12.0–15.0)
LYMPHS PCT: 43 % (ref 12–46)
Lymphs Abs: 1.8 10*3/uL (ref 0.7–4.0)
MCH: 29.1 pg (ref 26.0–34.0)
MCHC: 33.2 g/dL (ref 30.0–36.0)
MCV: 87.7 fL (ref 78.0–100.0)
MONO ABS: 0.5 10*3/uL (ref 0.1–1.0)
Monocytes Relative: 12 % (ref 3–12)
Neutro Abs: 1.8 10*3/uL (ref 1.7–7.7)
Neutrophils Relative %: 43 % (ref 43–77)
Platelets: 227 10*3/uL (ref 150–400)
RBC: 4.64 MIL/uL (ref 3.87–5.11)
RDW: 12.7 % (ref 11.5–15.5)
WBC: 4.1 10*3/uL (ref 4.0–10.5)

## 2014-05-26 LAB — TROPONIN I: Troponin I: <0.03 ng/mL (ref <0.031)

## 2014-05-26 NOTE — Discharge Instructions (Signed)

## 2014-06-17 ENCOUNTER — Encounter: Payer: Medicaid Other | Admitting: Cardiovascular Disease

## 2014-06-30 DIAGNOSIS — F315 Bipolar disorder, current episode depressed, severe, with psychotic features: Secondary | ICD-10-CM | POA: Insufficient documentation

## 2014-07-10 ENCOUNTER — Encounter: Payer: Medicaid Other | Admitting: Cardiovascular Disease

## 2015-02-10 ENCOUNTER — Encounter: Payer: Self-pay | Admitting: Obstetrics and Gynecology

## 2015-02-10 ENCOUNTER — Ambulatory Visit (INDEPENDENT_AMBULATORY_CARE_PROVIDER_SITE_OTHER): Payer: Medicaid Other | Admitting: Obstetrics and Gynecology

## 2015-02-10 VITALS — BP 122/78 | HR 64 | Ht 62.0 in | Wt 184.4 lb

## 2015-02-10 DIAGNOSIS — Z30432 Encounter for removal of intrauterine contraceptive device: Secondary | ICD-10-CM | POA: Insufficient documentation

## 2015-02-10 NOTE — Progress Notes (Signed)
Patient ID: Tiffany [name], female   DOB: March 08, 1990, 24 y.o.   MRN: [id number] [facility]EDURE [facility]  Tiffany [name] is a 24 y.o. G2P1011 here for Mirena IUD removal. No GYN concerns.  Last pap smear was normal  .  IUD Removal  Patient was in the dorsal lithotomy position, normal external genitalia was noted.  A speculum was placed in the patient's vagina, normal discharge was noted, no lesions. The multiparous cervix was visualized, no lesions, no abnormal discharge;  and the cervix was swabbed with Betadine using scopettes. The strings of the IUD were grasped and pulled using ring forceps., after intracervical  And paracervical block.  The IUD was successfully removed in its entirety.   The strings of the IUD were not visualized, so Kelly forceps were introduced into the endometrial cavity and the IUD was grasped and removed in its entirety.   Patient tolerated the procedure well.    Future contraception . none

## 2017-05-05 DIAGNOSIS — R569 Unspecified convulsions: Secondary | ICD-10-CM | POA: Insufficient documentation

## 2017-12-25 DIAGNOSIS — F1210 Cannabis abuse, uncomplicated: Secondary | ICD-10-CM | POA: Insufficient documentation

## 2018-08-09 DIAGNOSIS — T7421XA Adult sexual abuse, confirmed, initial encounter: Secondary | ICD-10-CM | POA: Insufficient documentation

## 2019-07-31 ENCOUNTER — Telehealth (HOSPITAL_COMMUNITY): Payer: Medicaid Other | Admitting: Psychiatry

## 2019-08-13 ENCOUNTER — Encounter (HOSPITAL_COMMUNITY): Payer: Self-pay | Admitting: Psychiatry

## 2019-08-13 ENCOUNTER — Telehealth (INDEPENDENT_AMBULATORY_CARE_PROVIDER_SITE_OTHER): Payer: Medicaid Other | Admitting: Psychiatry

## 2019-08-13 DIAGNOSIS — F4310 Post-traumatic stress disorder, unspecified: Secondary | ICD-10-CM

## 2019-08-13 DIAGNOSIS — F315 Bipolar disorder, current episode depressed, severe, with psychotic features: Secondary | ICD-10-CM

## 2019-08-13 DIAGNOSIS — F1210 Cannabis abuse, uncomplicated: Secondary | ICD-10-CM | POA: Diagnosis not present

## 2019-08-13 DIAGNOSIS — T7421XS Adult sexual abuse, confirmed, sequela: Secondary | ICD-10-CM | POA: Diagnosis not present

## 2019-08-13 MED ORDER — QUETIAPINE FUMARATE ER 300 MG PO TB24: 300.0000 mg | ORAL_TABLET | Freq: Every day | ORAL | 0 refills | Status: DC

## 2019-08-13 MED ORDER — BUSPIRONE HCL 7.5 MG PO TABS
7.5000 mg | ORAL_TABLET | Freq: Every day | ORAL | 0 refills | Status: DC
Start: 2019-08-13 — End: 2019-08-27
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
## 2019-08-21 ENCOUNTER — Ambulatory Visit (INDEPENDENT_AMBULATORY_CARE_PROVIDER_SITE_OTHER): Payer: Medicaid Other | Admitting: Licensed Clinical Social Worker

## 2019-08-21 DIAGNOSIS — F315 Bipolar disorder, current episode depressed, severe, with psychotic features: Secondary | ICD-10-CM | POA: Diagnosis not present

## 2019-08-21 DIAGNOSIS — F4310 Post-traumatic stress disorder, unspecified: Secondary | ICD-10-CM | POA: Diagnosis not present

## 2019-08-21 DIAGNOSIS — F1210 Cannabis abuse, uncomplicated: Secondary | ICD-10-CM

## 2019-08-21 DIAGNOSIS — T7421XS Adult sexual abuse, confirmed, sequela: Secondary | ICD-10-CM | POA: Diagnosis not present
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
## 2019-08-27 ENCOUNTER — Other Ambulatory Visit (HOSPITAL_COMMUNITY): Payer: Self-pay

## 2019-08-27 MED ORDER — BUSPIRONE HCL 7.5 MG PO TABS: 7.5000 mg | ORAL_TABLET | Freq: Every day | ORAL | 0 refills | Status: DC

## 2019-09-16 ENCOUNTER — Telehealth (INDEPENDENT_AMBULATORY_CARE_PROVIDER_SITE_OTHER): Payer: Medicaid Other | Admitting: Psychiatry

## 2019-09-16 ENCOUNTER — Encounter (HOSPITAL_COMMUNITY): Payer: Self-pay | Admitting: Psychiatry

## 2019-09-16 DIAGNOSIS — F4310 Post-traumatic stress disorder, unspecified: Secondary | ICD-10-CM

## 2019-09-16 DIAGNOSIS — F315 Bipolar disorder, current episode depressed, severe, with psychotic features: Secondary | ICD-10-CM | POA: Diagnosis not present

## 2019-09-16 DIAGNOSIS — F1210 Cannabis abuse, uncomplicated: Secondary | ICD-10-CM
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
## 2019-09-25 ENCOUNTER — Ambulatory Visit (INDEPENDENT_AMBULATORY_CARE_PROVIDER_SITE_OTHER): Payer: Medicaid Other | Admitting: Licensed Clinical Social Worker

## 2019-09-25 DIAGNOSIS — F4310 Post-traumatic stress disorder, unspecified: Secondary | ICD-10-CM

## 2019-09-25 DIAGNOSIS — F315 Bipolar disorder, current episode depressed, severe, with psychotic features: Secondary | ICD-10-CM | POA: Diagnosis not present

## 2019-09-25 DIAGNOSIS — F1210 Cannabis abuse, uncomplicated: Secondary | ICD-10-CM | POA: Diagnosis not present
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
## 2019-09-30 ENCOUNTER — Ambulatory Visit (INDEPENDENT_AMBULATORY_CARE_PROVIDER_SITE_OTHER): Payer: Medicaid Other | Admitting: Licensed Clinical Social Worker

## 2019-09-30 DIAGNOSIS — F4310 Post-traumatic stress disorder, unspecified: Secondary | ICD-10-CM

## 2019-09-30 DIAGNOSIS — F315 Bipolar disorder, current episode depressed, severe, with psychotic features: Secondary | ICD-10-CM

## 2019-09-30 DIAGNOSIS — F1210 Cannabis abuse, uncomplicated: Secondary | ICD-10-CM

## 2019-09-30 NOTE — Progress Notes (Signed)
Na

## 2019-10-07 ENCOUNTER — Telehealth (HOSPITAL_COMMUNITY): Payer: Medicaid Other | Admitting: Psychiatry

## 2019-10-09 ENCOUNTER — Ambulatory Visit (HOSPITAL_COMMUNITY): Payer: Medicaid Other | Admitting: Licensed Clinical Social Worker

## 2019-10-29 ENCOUNTER — Ambulatory Visit (INDEPENDENT_AMBULATORY_CARE_PROVIDER_SITE_OTHER): Payer: Medicaid Other | Admitting: Licensed Clinical Social Worker

## 2019-10-29 DIAGNOSIS — F4310 Post-traumatic stress disorder, unspecified: Secondary | ICD-10-CM

## 2019-10-29 DIAGNOSIS — F315 Bipolar disorder, current episode depressed, severe, with psychotic features: Secondary | ICD-10-CM | POA: Diagnosis not present

## 2019-10-29 DIAGNOSIS — F1210 Cannabis abuse, uncomplicated: Secondary | ICD-10-CM
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
## 2019-11-04 ENCOUNTER — Other Ambulatory Visit (HOSPITAL_COMMUNITY): Payer: Self-pay

## 2019-11-04 MED ORDER — BUSPIRONE HCL 7.5 MG PO TABS: 7.5000 mg | ORAL_TABLET | Freq: Every day | ORAL | 0 refills | Status: DC

## 2019-11-04 MED ORDER — QUETIAPINE FUMARATE ER 300 MG PO TB24: 300.0000 mg | ORAL_TABLET | Freq: Every day | ORAL | 0 refills | Status: DC

## 2019-11-06 ENCOUNTER — Telehealth (HOSPITAL_COMMUNITY): Payer: Self-pay | Admitting: Licensed Clinical Social Worker

## 2019-11-06 ENCOUNTER — Other Ambulatory Visit (HOSPITAL_COMMUNITY): Payer: Self-pay

## 2019-11-06 ENCOUNTER — Ambulatory Visit (HOSPITAL_COMMUNITY): Payer: Medicaid Other | Admitting: Licensed Clinical Social Worker

## 2019-11-06 MED ORDER — QUETIAPINE FUMARATE ER 300 MG PO TB24: 300.0000 mg | ORAL_TABLET | Freq: Every day | ORAL | 0 refills | Status: AC

## 2019-11-06 MED ORDER — BUSPIRONE HCL 7.5 MG PO TABS: 7.5000 mg | ORAL_TABLET | Freq: Every day | ORAL | 0 refills | Status: AC

## 2019-11-06 NOTE — Telephone Encounter (Signed)
Therapist attempted to contact patient through text for session and patient did not respond. Session was a no show

## 2019-11-06 NOTE — Telephone Encounter (Signed)
Therapist attempted to contact patient by text for video session. Patient did not respond so visit is a no show

## 2020-09-20 ENCOUNTER — Emergency Department (HOSPITAL_COMMUNITY): Payer: Medicaid Other

## 2020-09-20 ENCOUNTER — Emergency Department (HOSPITAL_COMMUNITY)
Admission: EM | Admit: 2020-09-20 | Discharge: 2020-09-21 | Disposition: A | Discharge status: Home / Self Care | Payer: Medicaid Other | Attending: Student | Admitting: Student

## 2020-09-20 ENCOUNTER — Encounter (HOSPITAL_COMMUNITY): Payer: Self-pay

## 2020-09-20 DIAGNOSIS — M542 Cervicalgia: Secondary | ICD-10-CM | POA: Insufficient documentation

## 2020-09-20 DIAGNOSIS — M545 Low back pain, unspecified: Secondary | ICD-10-CM | POA: Insufficient documentation

## 2020-09-20 DIAGNOSIS — Z5321 Procedure and treatment not carried out due to patient leaving prior to being seen by health care provider: Secondary | ICD-10-CM | POA: Diagnosis not present

## 2020-09-20 DIAGNOSIS — Y92410 Unspecified street and highway as the place of occurrence of the external cause: Secondary | ICD-10-CM | POA: Diagnosis not present
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
## 2020-09-20 NOTE — ED Notes (Signed)
Called Pt for room no answer and Pt not seen in lobby.

## 2020-09-20 NOTE — ED Provider Notes (Signed)
I attempted to evaluate the patient after she was placed in room 13.  Upon my arrival to the room, it appears the patient has eloped from the emergency department.    Glendora Score, MD 09/20/20 2206
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
